# Patient Record
Sex: Female | Born: 1945
Health system: Southern US, Community
[De-identification: ages and names within clinical notes are randomized; demographics above are authoritative.]

## PROBLEM LIST (undated history)

## (undated) DIAGNOSIS — K5790 Diverticulosis of intestine, part unspecified, without perforation or abscess without bleeding: Secondary | ICD-10-CM

## (undated) DIAGNOSIS — I2510 Atherosclerotic heart disease of native coronary artery without angina pectoris: Secondary | ICD-10-CM

## (undated) DIAGNOSIS — E785 Hyperlipidemia, unspecified: Secondary | ICD-10-CM

## (undated) DIAGNOSIS — I10 Essential (primary) hypertension: Secondary | ICD-10-CM

## (undated) HISTORY — DX: Atherosclerotic heart disease of native coronary artery without angina pectoris: I25.10

## (undated) HISTORY — PX: THUMB ARTHROSCOPY: SHX2509

## (undated) HISTORY — DX: Essential (primary) hypertension: I10

## (undated) HISTORY — DX: Diverticulosis of intestine, part unspecified, without perforation or abscess without bleeding: K57.90

## (undated) HISTORY — DX: Hyperlipidemia, unspecified: E78.5

---

## 2001-12-22 HISTORY — PX: COLONOSCOPY: SHX174

## 2003-01-16 DIAGNOSIS — I2510 Atherosclerotic heart disease of native coronary artery without angina pectoris: Secondary | ICD-10-CM

## 2003-01-16 HISTORY — DX: Atherosclerotic heart disease of native coronary artery without angina pectoris: I25.10

## 2003-01-16 HISTORY — PX: CARDIAC CATHETERIZATION: SHX172

## 2003-11-09 ENCOUNTER — Inpatient Hospital Stay (HOSPITAL_COMMUNITY): Admission: AD | Admit: 2003-11-09 | Discharge: 2003-11-12 | Payer: Self-pay | Admitting: *Deleted

## 2003-11-11 ENCOUNTER — Encounter: Payer: Self-pay | Admitting: Cardiology

## 2003-11-22 ENCOUNTER — Ambulatory Visit (HOSPITAL_COMMUNITY): Admission: RE | Admit: 2003-11-22 | Discharge: 2003-11-22 | Payer: Self-pay | Admitting: Cardiology

## 2003-11-22 ENCOUNTER — Ambulatory Visit: Payer: Self-pay | Admitting: Cardiology

## 2003-12-06 ENCOUNTER — Encounter (HOSPITAL_COMMUNITY): Admission: RE | Admit: 2003-12-06 | Discharge: 2004-03-05 | Payer: Self-pay | Admitting: Cardiology

## 2003-12-14 ENCOUNTER — Ambulatory Visit: Payer: Self-pay | Admitting: Cardiology

## 2004-01-28 ENCOUNTER — Ambulatory Visit: Payer: Self-pay | Admitting: Cardiology

## 2004-02-02 ENCOUNTER — Ambulatory Visit: Payer: Self-pay | Admitting: Cardiology

## 2004-03-06 ENCOUNTER — Encounter (HOSPITAL_COMMUNITY): Admission: RE | Admit: 2004-03-06 | Discharge: 2004-06-04 | Payer: Self-pay | Admitting: Cardiology

## 2004-04-07 ENCOUNTER — Ambulatory Visit: Payer: Self-pay | Admitting: Cardiology

## 2004-04-14 ENCOUNTER — Ambulatory Visit: Payer: Self-pay | Admitting: Cardiology

## 2004-04-14 ENCOUNTER — Ambulatory Visit: Payer: Self-pay

## 2004-04-18 ENCOUNTER — Ambulatory Visit: Payer: Self-pay | Admitting: Cardiology

## 2004-05-03 ENCOUNTER — Ambulatory Visit: Payer: Self-pay

## 2004-08-03 ENCOUNTER — Ambulatory Visit: Payer: Self-pay | Admitting: Cardiology

## 2004-10-23 ENCOUNTER — Ambulatory Visit: Payer: Self-pay | Admitting: Cardiology

## 2005-01-24 ENCOUNTER — Ambulatory Visit: Payer: Self-pay | Admitting: Cardiology

## 2005-04-18 ENCOUNTER — Observation Stay (HOSPITAL_COMMUNITY): Admission: EM | Admit: 2005-04-18 | Discharge: 2005-04-19 | Payer: Self-pay | Admitting: *Deleted

## 2005-04-18 ENCOUNTER — Ambulatory Visit: Payer: Self-pay | Admitting: Cardiology

## 2005-04-25 ENCOUNTER — Ambulatory Visit: Payer: Self-pay | Admitting: Cardiology

## 2005-05-01 ENCOUNTER — Ambulatory Visit: Payer: Self-pay | Admitting: Cardiology

## 2005-09-04 ENCOUNTER — Ambulatory Visit: Payer: Self-pay | Admitting: Internal Medicine

## 2005-10-29 ENCOUNTER — Ambulatory Visit: Payer: Self-pay | Admitting: Cardiology

## 2006-07-01 ENCOUNTER — Ambulatory Visit: Payer: Self-pay | Admitting: Cardiology

## 2006-10-16 ENCOUNTER — Ambulatory Visit: Payer: Self-pay | Admitting: Cardiology

## 2007-07-15 ENCOUNTER — Ambulatory Visit: Payer: Self-pay | Admitting: Cardiology

## 2008-06-21 DIAGNOSIS — I10 Essential (primary) hypertension: Secondary | ICD-10-CM

## 2008-06-21 DIAGNOSIS — E785 Hyperlipidemia, unspecified: Secondary | ICD-10-CM

## 2008-06-21 DIAGNOSIS — I2510 Atherosclerotic heart disease of native coronary artery without angina pectoris: Secondary | ICD-10-CM

## 2008-06-22 ENCOUNTER — Ambulatory Visit: Payer: Self-pay | Admitting: Cardiology

## 2008-10-01 ENCOUNTER — Telehealth: Payer: Self-pay | Admitting: Cardiology

## 2009-07-25 ENCOUNTER — Ambulatory Visit: Payer: Self-pay | Admitting: Cardiology

## 2009-09-30 ENCOUNTER — Telehealth: Payer: Self-pay | Admitting: Cardiology

## 2009-10-06 ENCOUNTER — Ambulatory Visit: Payer: Self-pay | Admitting: Cardiology

## 2009-10-11 LAB — CONVERTED CEMR LAB
ALT: 15 units/L (ref 0–35)
AST: 19 units/L (ref 0–37)
Albumin: 4 g/dL (ref 3.5–5.2)
Alkaline Phosphatase: 51 units/L (ref 39–117)
BUN: 17 mg/dL (ref 6–23)
Basophils Absolute: 0 10*3/uL (ref 0.0–0.1)
Basophils Relative: 0.5 % (ref 0.0–3.0)
Bilirubin, Direct: 0.1 mg/dL (ref 0.0–0.3)
CO2: 28 meq/L (ref 19–32)
Calcium: 9 mg/dL (ref 8.4–10.5)
Chloride: 102 meq/L (ref 96–112)
Cholesterol: 149 mg/dL (ref 0–200)
Creatinine, Ser: 0.7 mg/dL (ref 0.4–1.2)
Eosinophils Absolute: 0.2 10*3/uL (ref 0.0–0.7)
Eosinophils Relative: 3.1 % (ref 0.0–5.0)
GFR calc non Af Amer: 88.44 mL/min (ref >60)
Glucose, Bld: 92 mg/dL (ref 70–99)
HCT: 39 % (ref 36.0–46.0)
HDL: 71.2 mg/dL (ref >39.00)
Hemoglobin: 13.4 g/dL (ref 12.0–15.0)
LDL Cholesterol: 71 mg/dL (ref 0–99)
Lymphocytes Relative: 39.2 % (ref 12.0–46.0)
Lymphs Abs: 1.9 10*3/uL (ref 0.7–4.0)
MCHC: 34.4 g/dL (ref 30.0–36.0)
MCV: 93.3 fL (ref 78.0–100.0)
Monocytes Absolute: 0.4 10*3/uL (ref 0.1–1.0)
Monocytes Relative: 7.2 % (ref 3.0–12.0)
Neutro Abs: 2.5 10*3/uL (ref 1.4–7.7)
Neutrophils Relative %: 50 % (ref 43.0–77.0)
Platelets: 179 10*3/uL (ref 150.0–400.0)
Potassium: 4.8 meq/L (ref 3.5–5.1)
RBC: 4.18 M/uL (ref 3.87–5.11)
RDW: 13 % (ref 11.5–14.6)
Sodium: 138 meq/L (ref 135–145)
TSH: 1.31 microintl units/mL (ref 0.35–5.50)
Total Bilirubin: 0.6 mg/dL (ref 0.3–1.2)
Total CHOL/HDL Ratio: 2
Total Protein: 6 g/dL (ref 6.0–8.3)
Triglycerides: 32 mg/dL (ref 0.0–149.0)
VLDL: 6.4 mg/dL (ref 0.0–40.0)
WBC: 5 10*3/uL (ref 4.5–10.5)

## 2009-10-25 ENCOUNTER — Encounter: Payer: Self-pay | Admitting: Cardiology

## 2010-02-12 LAB — CONVERTED CEMR LAB
ALT: 18 units/L (ref 0–35)
AST: 25 units/L (ref 0–37)
Albumin: 4 g/dL (ref 3.5–5.2)
Alkaline Phosphatase: 57 units/L (ref 39–117)
BUN: 12 mg/dL (ref 6–23)
Basophils Absolute: 0 10*3/uL (ref 0.0–0.1)
Basophils Relative: 0.2 % (ref 0.0–3.0)
Bilirubin, Direct: 0.1 mg/dL (ref 0.0–0.3)
CO2: 30 meq/L (ref 19–32)
Calcium: 8.8 mg/dL (ref 8.4–10.5)
Chloride: 105 meq/L (ref 96–112)
Cholesterol: 141 mg/dL (ref 0–200)
Creatinine, Ser: 0.7 mg/dL (ref 0.4–1.2)
Eosinophils Absolute: 0.1 10*3/uL (ref 0.0–0.7)
Eosinophils Relative: 2.3 % (ref 0.0–5.0)
GFR calc non Af Amer: 90.28 mL/min (ref >60)
Glucose, Bld: 97 mg/dL (ref 70–99)
HCT: 39.4 % (ref 36.0–46.0)
HDL: 75.1 mg/dL (ref >39.00)
Hemoglobin: 13.2 g/dL (ref 12.0–15.0)
LDL Cholesterol: 56 mg/dL (ref 0–99)
Lymphocytes Relative: 47.6 % — ABNORMAL HIGH (ref 12.0–46.0)
Lymphs Abs: 2.1 10*3/uL (ref 0.7–4.0)
MCHC: 33.6 g/dL (ref 30.0–36.0)
MCV: 93.4 fL (ref 78.0–100.0)
Monocytes Absolute: 0.3 10*3/uL (ref 0.1–1.0)
Monocytes Relative: 7.9 % (ref 3.0–12.0)
Neutro Abs: 1.8 10*3/uL (ref 1.4–7.7)
Neutrophils Relative %: 42 % — ABNORMAL LOW (ref 43.0–77.0)
Platelets: 180 10*3/uL (ref 150.0–400.0)
Potassium: 4.7 meq/L (ref 3.5–5.1)
RBC: 4.22 M/uL (ref 3.87–5.11)
RDW: 12.4 % (ref 11.5–14.6)
Sodium: 139 meq/L (ref 135–145)
TSH: 0.96 microintl units/mL (ref 0.35–5.50)
Total Bilirubin: 1 mg/dL (ref 0.3–1.2)
Total CHOL/HDL Ratio: 2
Total Protein: 6.5 g/dL (ref 6.0–8.3)
Triglycerides: 49 mg/dL (ref 0.0–149.0)
VLDL: 9.8 mg/dL (ref 0.0–40.0)
WBC: 4.3 10*3/uL — ABNORMAL LOW (ref 4.5–10.5)

## 2010-02-14 NOTE — Progress Notes (Signed)
Summary: order for lab work    Phone Note Call from Patient   Caller: Patient Reason for Call: Talk to Nurse Summary of Call: pt wants an order to have a cholesterol check here- [phone] Initial call taken by: Glynda Jaeger,  September 30, 2009 1:47 PM  Follow-up for Phone Call        [facility]. Sherri Rad, RN, BSN  September 30, 2009 2:11 PM  I spoke with the pt. She will come in next thursday for labs- cbc/bmet/lipid/liver/tsh. Follow-up by: Sherri Rad, RN, BSN,  September 30, 2009 2:17 PM

## 2010-02-14 NOTE — Assessment & Plan Note (Signed)
Summary: yearly/sl      Allergies Added: NKDA  Visit Type:  Follow-up Primary Provider:  [facility]  CC:  no complaints.  History of Present Illness: The patient is 65 years old and return for management of CAD. In 2005 she had an anterior wall MI treated with a Taxus stent to the LAD. Her ejection fraction was initially 30% but her ventricle but completely recovered to normal. She has done quite well since that time has had no recent chest pain shortness of breath or palpitations. She is very active and does horseback riding as well as other activities.  Her other problems include hypertension and hyperlipidemia.  Current Medications (verified): 1)  Benicar 20 Mg Tabs (Olmesartan Medoxomil) .... Take One Tablet By Mouth Once Daily 2)  Vytorin 10-40 Mg Tabs (Ezetimibe-Simvastatin) .... Take One Tablet By Mouth Dailyat Bedtime 3)  Plavix 75 Mg Tabs (Clopidogrel Bisulfate) .... Take One Tablet By Mouth Daily 4)  Aspirin 81 Mg Tbec (Aspirin) .... Take One Tablet By Mouth Daily 5)  Nitrostat 0.4 Mg Subl (Nitroglycerin) .... Take As Directed  Allergies (verified): No Known Drug Allergies  Past History:  Past Medical History: Reviewed history from 06/22/2008 and no changes required. 1. Coronary artery disease, status post anterior wall myocardial     infarction treated with a Taxus stent to the left anterior     descending.2005 2. Good left ventricular function. 3. Hyperlipidemia, under excellent control. 4. Hypertension, under excellent control.   Review of Systems       ROS is negative except as outlined in HPI.   Vital Signs:  Patient profile:   65 year old female Height:      63 inches Weight:      128 pounds BMI:     22.76 Pulse rate:   57 / minute Resp:     16 per minute BP sitting:   134 / 62  (left arm)  Vitals Entered By: Marrion Coy, CNA (July 25, 2009 4:19 PM)  Physical Exam  Additional Exam:  Gen. Well-nourished, in no distress   Neck:  No JVD, thyroid not enlarged, no carotid bruits Lungs: No tachypnea, clear without rales, rhonchi or wheezes Cardiovascular: Rhythm regular, PMI not displaced,  heart sounds  normal, no murmurs or gallops, no peripheral edema, pulses normal in all 4 extremities. Abdomen: BS normal, abdomen soft and non-tender without masses or organomegaly, no hepatosplenomegaly. MS: No deformities, no cyanosis or clubbing   Neuro:  No focal sns   Skin:  no lesions    Impression & Recommendations:  Problem # 1:  CAD (ICD-414.00) She had an anterior wall MI in 2005 treated with a Taxus stent in the LAD. Her ventricle completely recovered to normal LV function. She's had no recent chest pain this problem appears stable.  She is having some easy bruising and we discussed the possibility of her coming off Plavix. She has a brother and sister who had stents and they have stayed on Plavix and she feels most comfortable continuing the Plavix.  We will have her followup with Dr. Excell Seltzer in one year.   The following medications were removed from the medication list:    Metoprolol Succinate 25 Mg Xr24h-tab (Metoprolol succinate) .Marland Kitchen... Take one tablet by mouth daily Her updated medication list for this problem includes:    Plavix 75 Mg Tabs (Clopidogrel bisulfate) .Marland Kitchen... Take one tablet by mouth daily    Aspirin 81 Mg Tbec (Aspirin) .Marland Kitchen... Take one tablet by mouth daily  Nitrostat 0.4 Mg Subl (Nitroglycerin) .Marland Kitchen... Take as directed  Orders: EKG w/ Interpretation (93000)  The following medications were removed from the medication list:    Metoprolol Succinate 25 Mg Xr24h-tab (Metoprolol succinate) .Marland Kitchen... Take one tablet by mouth daily Her updated medication list for this problem includes:    Plavix 75 Mg Tabs (Clopidogrel bisulfate) .Marland Kitchen... Take one tablet by mouth daily    Aspirin 81 Mg Tbec (Aspirin) .Marland Kitchen... Take one tablet by mouth daily    Nitrostat 0.4 Mg Subl (Nitroglycerin) .Marland Kitchen... Take as directed  Problem #  2:  HYPERLIPIDEMIA (ICD-272.4) Her last lipid profile was excellent with an HDL of 75, and LDL of 56, total cholesterol 141. We will repeat her lipid profile with her primary care physician Her updated medication list for this problem includes:    Vytorin 10-40 Mg Tabs (Ezetimibe-simvastatin) .Marland Kitchen... Take one tablet by mouth dailyat bedtime  Problem # 3:  HYPERTENSION (ICD-401.9) Or blood pressure is well controlled on current medications. She is on Benicar so we will get a BMP. The following medications were removed from the medication list:    Metoprolol Succinate 25 Mg Xr24h-tab (Metoprolol succinate) .Marland Kitchen... Take one tablet by mouth daily Her updated medication list for this problem includes:    Benicar 20 Mg Tabs (Olmesartan medoxomil) .Marland Kitchen... Take one tablet by mouth once daily    Aspirin 81 Mg Tbec (Aspirin) .Marland Kitchen... Take one tablet by mouth daily  Patient Instructions: 1)  Your physician recommends that you schedule a follow-up appointment in: 1year with Dr. Excell Seltzer 2)  Your physician recommends that you  have lab work at [facility]:  cbc, bmp, lipid, liver, tsh with results faxed to our office. 3)  Your physician recommends that you continue on your current medications as directed. Please refer to the Current Medication list given to you today.

## 2010-02-16 NOTE — Letter (Signed)
Summary: Dr Perrin Maltese Office Note   Dr Angelia Mould Ingram's Office Note   Imported By: Roderic Ovens 02/02/2010 14:54:05  _____________________________________________________________________  External Attachment:    Type:   Image     Comment:   External Document

## 2010-05-27 ENCOUNTER — Encounter: Payer: Self-pay | Admitting: Physician Assistant

## 2010-05-30 ENCOUNTER — Ambulatory Visit (INDEPENDENT_AMBULATORY_CARE_PROVIDER_SITE_OTHER): Payer: 59 | Admitting: Physician Assistant

## 2010-05-30 ENCOUNTER — Encounter: Payer: Self-pay | Admitting: Physician Assistant

## 2010-05-30 VITALS — BP 148/80 | HR 53 | Resp 18 | Ht 63.0 in | Wt 129.1 lb

## 2010-05-30 DIAGNOSIS — I2510 Atherosclerotic heart disease of native coronary artery without angina pectoris: Secondary | ICD-10-CM

## 2010-05-30 DIAGNOSIS — R079 Chest pain, unspecified: Secondary | ICD-10-CM | POA: Insufficient documentation

## 2010-05-30 DIAGNOSIS — I10 Essential (primary) hypertension: Secondary | ICD-10-CM

## 2010-05-30 LAB — CBC WITH DIFFERENTIAL/PLATELET
Basophils Absolute: 0 10*3/uL (ref 0.0–0.1)
Basophils Relative: 0.3 % (ref 0.0–3.0)
Eosinophils Absolute: 0.1 10*3/uL (ref 0.0–0.7)
Eosinophils Relative: 2.2 % (ref 0.0–5.0)
HCT: 39.5 % (ref 36.0–46.0)
Hemoglobin: 13.5 g/dL (ref 12.0–15.0)
Lymphocytes Relative: 43.8 % (ref 12.0–46.0)
Lymphs Abs: 2.4 10*3/uL (ref 0.7–4.0)
MCHC: 34.2 g/dL (ref 30.0–36.0)
MCV: 92.8 fl (ref 78.0–100.0)
Monocytes Absolute: 0.4 10*3/uL (ref 0.1–1.0)
Monocytes Relative: 6.9 % (ref 3.0–12.0)
Neutro Abs: 2.6 10*3/uL (ref 1.4–7.7)
Neutrophils Relative %: 46.8 % (ref 43.0–77.0)
Platelets: 196 10*3/uL (ref 150.0–400.0)
RBC: 4.25 Mil/uL (ref 3.87–5.11)
RDW: 13.9 % (ref 11.5–14.6)
WBC: 5.5 10*3/uL (ref 4.5–10.5)

## 2010-05-30 LAB — BASIC METABOLIC PANEL
BUN: 16 mg/dL (ref 6–23)
CO2: 30 mEq/L (ref 19–32)
Calcium: 9.5 mg/dL (ref 8.4–10.5)
Chloride: 99 mEq/L (ref 96–112)
Creatinine, Ser: 0.8 mg/dL (ref 0.4–1.2)
GFR: 82.85 mL/min (ref >60.00)
Glucose, Bld: 83 mg/dL (ref 70–99)
Potassium: 4.7 mEq/L (ref 3.5–5.1)
Sodium: 136 mEq/L (ref 135–145)

## 2010-05-30 LAB — TSH: TSH: 1 u[IU]/mL (ref 0.35–5.50)

## 2010-05-30 NOTE — Assessment & Plan Note (Signed)
[facility]                            CARDIOLOGY OFFICE NOTE   LASHAY, OSBORNE                   MRN:          [id number]  DATE:10/16/2006                            DOB:          19-Dec-1945    The patient is a very pleasant 65 year old white female patient of Dr.  Charlies Constable who has a history of hypertension and coronary artery  disease, status post anterior wall MI treated with Taxus stenting to the  LAD in October of 2005.  She has residual 50% RCA and good LV function.  She comes in today because of trouble with increasing her blood  pressure.  She brings her blood pressure machine with her and it has  been up and down on occasion.  She says when it is up, she occasionally  notices a tingling in her jaw and then down both arms.  This occurs at  rest and not with exertion.  She exercises on an elliptical trainer at  the gym and does some weights and never has any exertional neck or arm  tingling.  These symptoms are always associated with an elevated blood  pressure and are not similar to her symptoms when she had her heart  attack.  Blood pressure readings range anywhere from 90/48 up to 172/90  was the highest.  She does state that she eats out occasionally, [employer] and so forth, and it may be associated with the times her  blood pressures have run high.  When she rushed in here today she had  taken her own blood pressure and it was 160/70, but when we took it, it  was 114/72.  Of note, Dr. Juanda Chance had decreased her Toprol from 25 to  12.5 mg daily, but since her blood pressures have been running higher,  she has increased this back up to 25 mg over the past week.  The patient  also has been having a lot of trouble with gas recently and has been  seeing the doctors at [facility] and she has been given  Nexium.  She is still having a lot of gas build up and is due to follow  up with them concerning this.  She  states this was when her blood  pressure started going up as well.   CURRENT MEDICATIONS:  1. Vytorin 10/40 mg daily.  2. Aspirin 81 mg daily.  3. Toprol XL 25 mg daily.  4. Plavix 75 mg daily.  5. Lisinopril 20 mg daily.  6. Nexium p.r.n.   PHYSICAL EXAMINATION:  GENERAL:  This is a very pleasant 65 year old  white female in no acute distress.  VITAL SIGNS:  Blood pressure 114/72, pulse 49, weight 125.  NEUROLOGY:  Without JVD, HJR, bruit, or thyroid enlargement.  LUNGS:  Clear anterior, posterior, and lateral.  HEART:  Regular rate and rhythm at 50 beats per minute, normal S1 and  S2.  No murmur, rub, bruit, thrill, or heave noted.  ABDOMEN:  Soft without organomegaly, masses, lesions, or abnormal  tenderness.  EXTREMITIES:  Without cyanosis, clubbing, or edema.  She has good distal  pulses.   EKG; sinus bradycardia at 49 beats per minute, T wave inversion in V1  unchanged from prior tracing.   IMPRESSION:  1. Labile hypertension.  2. Jaw and arm tingling associated with elevated blood pressures, rule      out ischemic equivalent.  3. Coronary artery disease, status post anterior wall myocardial      infarction treated with Taxus stent to the left anterior descending      in 2005 with residual 50% right coronary artery.  Normal left      ventricular function.  4. Hyperlipidemia.  5. Family history of coronary artery disease.  6. Recent gastrointestinal problem.   PLAN:  I have asked the patient to watch her sodium intake because of  her low heart rates.  I have asked her to decrease her metoprolol to  12.5 mg a day and then take an extra half if her blood pressure is  elevated later in the day.  In the meantime, we will do a stress Myoview  on her to rule out ischemia and she will follow up with Dr. Juanda Chance.      Jacolyn Reedy, PA-C  Electronically Signed      Everardo Beals. Juanda Chance, MD, [facility]  Electronically Signed   ML/MedQ  DD: 10/16/2006  DT: 10/16/2006  Job #:  [id number]   cc:   [facility]

## 2010-05-30 NOTE — Assessment & Plan Note (Signed)
[facility]                            CARDIOLOGY OFFICE NOTE   [name], [name]                   MRN:          [id number]  DATE:07/01/2006                            DOB:          13-Oct-1945    PRIMARY CARE PHYSICIAN:  [facility].   CLINICAL HISTORY:  [name] is 65 years old, and had an anterior  wall infarction in 2005 treated with a Taxus stent to the LAD.  Her  ejection fraction was initially 30% but then improved to normal, and she  had a negative Myoview scan with no scar or ischemia, subsequently.  She  had residual 50% narrowing in the right coronary artery.   She has done quite well and has had no recent chest pain, shortness of  breath, or palpitations.   PAST MEDICAL HISTORY:  Hypertension and hyperlipidemia.   CURRENT MEDICATIONS:  1. Vytorin.  2. Aspirin.  3. Toprol.  4. Plavix.  5. Benicar.   PHYSICAL EXAMINATION:  The blood pressure is 140/79, the pulse is 46 and  regular.  There was no venous distension.  Carotid pulses were full without  bruits.  Chest was clear without rales or rhonchi.  The cardiac rhythm was regular.  She had no murmurs or gallops.  The abdomen was soft without organomegaly.  Peripheral pulses were full and there was no peripheral edema.   An electrocardiogram was normal except for sinus bradycardia.   IMPRESSION:  1. Coronary artery disease, status post anterior wall myocardial      infarction treated with a Taxus stent to the LAD, 2005 with 50%      narrowing in the right coronary artery.  2. Ejection fraction initially 30%, now improved to normal.  3. Hypertension.  4. Hyperlipidemia.  5. Strong family history of coronary artery disease.   RECOMMENDATIONS:  I think [name] is doing quite well.  Her  insurance company recommended that she switch from Benicar to lisinopril  for cost savings, and I think this would be reasonable.  I cautioned her  about  potential side effects from cough.  Will put her on lisinopril 20  mg a day.  We will arrange a followup blood pressure check in a week.  She is also to get a followup lipid profile with [facility].  She asked about exercise heart rate  and I looked at her old exercise test, and I think getting a heart rate  from 90 up to 110-115 would be good for her.  Will plan to see her back  in followup in a year.     Bruce Elvera Lennox Juanda Chance, MD, [facility]  Electronically Signed    BRB/MedQ  DD: 07/01/2006  DT: 07/02/2006  Job #: [id number]

## 2010-05-30 NOTE — Assessment & Plan Note (Signed)
[facility]                                 ON-CALL NOTE   MORRISON, MASSER                     MRN:          [id number]  DATE:10/07/2006                            DOB:          1945/12/08    PRIMARY CARDIOLOGIST:  Dr. Charlies Constable.   [name] called tonight because she stated that she was having her  blood pressure run higher than usual, and occasionally was having some  problems with tingling in her arms and fingers.  Of note, [name]  went to the emergency room after work for these symptoms, where her  blood pressure was 178/76, which she said was taken just after she had  walked into the room and sat down.  She stated that she felt like it  would be a long time before she was seen in the emergency room, and she  did not want to wait, so she left.   [name] states that she takes 1/2 of a Toprol XL 25 mg daily as  well as Benicar 20.  Because of stress at work, she felt like her blood  pressure was running higher than normal, and so she has taken, over the  course of the evening, an extra 1/2 tablet of the Toprol XL, as well as  an extra Benicar.  She was concerned that they would kick in and make  her hypotensive, although currently she states her blood pressure  systolic is in the 150s.   I advised [name] that she needed evaluation for the hypertension.  I advised her that I could not tell how long it would take for her to be  seen in the emergency room, but if she felt she had any issues for which  she needed to be seen tonight, she should come to the emergency room by  EMS.  She stated that she did not feel it was that bad and did not wish  to do this.  I then requested that she take her blood pressure at  various times during the day and track these results, and bring them  with her when she comes to see Dr. Juanda Chance.  I have left a message for  her to get a call and an appointment as soon as possible.  She  stated  that she would do this.  I reiterated that she should call 9-1-1 and  come to the emergency room if her symptoms got any worse.      Theodore Demark, PA-C  Electronically Signed      Erin Pick. Eden Emms, MD, [facility]  Electronically Signed   RB/MedQ  DD: 10/07/2006  DT: 10/08/2006  Job #: [id number]

## 2010-05-30 NOTE — Assessment & Plan Note (Signed)
Somewhat uncontrolled.  She has had normal blood pressures at home as well as elevated blood pressures.  She feels that she is under more stress.  I have asked her to check her blood pressures and bring this with her to her next appointment.  If it continues to remain elevated, I will change her to Benicar/HCTZ 20/12.5 mg daily.

## 2010-05-30 NOTE — Progress Notes (Signed)
History of Present Illness: Primary Cardiologist: Dr. Tonny Bollman  [name] is a 65 y.o. female with a history of CAD, status post DES to the LAD in October 2005 secondary to anterior ST elevation myocardial infarction who was previously followed by Dr. Juanda Chance.  She will establish with Dr. Excell Seltzer and 7/12.  She presents today with complaints of chest discomfort and arm symptoms over the last 4 weeks.  She has felt some neck pain with tightness in her trapezius muscles bilaterally.  She denies any injury.  The pain in her chest is sharp and lasts for a few seconds and is a throbbing sensation.  She denies any exertional symptoms.  Her arm symptoms are described as tingling.  She denies any associated shortness of breath, nausea or diaphoresis.  She denies syncope or near-syncope.  She denies orthopnea, PND or edema.  She does note increased fatigue.  She's had a lot of stress at work.  She seemed to notice some relationship of her symptoms to meals.  She has taken some Zantac with some relief.  She did see her PCP who suggested that she follow up here.  Her symptoms are not getting any worse.  She has noted some changes with changes in positioning.  Her symptoms do not remind her of her myocardial infarction.  Past Medical History  Diagnosis Date  . Coronary artery disease 2005    a. s/p Ant STEMI 10/05 tx with Taxus DES to LAD;  b. cath 10/05: LM ok, LAD 99% tx with PCI, CFX ok, pRCA 50%, EF 30%;  c. echo 10/05: EF 55-60%, apical septal HK;  d. myoview 4/07: no scar or ischemia  . Hyperlipidemia   . Hypertension     Current Outpatient Prescriptions  Medication Sig Dispense Refill  . aspirin 81 MG EC tablet Take 81 mg by mouth daily.        . B Complex-C (SUPER B COMPLEX PO) Take by mouth daily.        . clopidogrel (PLAVIX) 75 MG tablet Take 75 mg by mouth daily.        Marland Kitchen ezetimibe-simvastatin (VYTORIN) 10-40 MG per tablet Take 1 tablet by mouth at bedtime.        . nitroGLYCERIN  (NITROSTAT) 0.4 MG SL tablet Place 0.4 mg under the tongue every 5 (five) minutes as needed.        Marland Kitchen olmesartan (BENICAR) 20 MG tablet Take 20 mg by mouth daily.          Allergies: No Known Allergies   History  Substance Use Topics  . Smoking status: Former Smoker -- 0.5 packs/day    Types: Cigarettes    Quit date: 01/15/1986  . Smokeless tobacco: Not on file  . Alcohol Use: 1.2 oz/week    1 Glasses of wine, 1 Cans of beer per week     BEER OR A GLASS OF WINE MOST NIGHTS    ROS:  Please see the history of present illness.  She denies fevers, chills, cough, melena, hematochezia.  She is going through menopause.  She did have some loose stools recently.  All other systems reviewed and negative.  Vital Signs: BP 148/80  Pulse 53  Resp 18  Ht 5\' 3"  (1.6 m)  Wt 129 lb 1.9 oz (58.568 kg)  BMI 22.87 kg/m2  PHYSICAL EXAM: Well nourished, well developed, in no acute distress HEENT: normal Neck: no JVD Vascular: No carotid bruits Endocrine: No thyromegaly Cardiac:  normal S1, S2; RRR;  no murmur Lungs:  clear to auscultation bilaterally, no wheezing, rhonchi or rales Abd: soft, nontender, no hepatomegaly Ext: no edema Skin: warm and dry Neuro:  CNs 2-12 intact, no focal abnormalities noted Psych: Normal affect  EKG:  Sinus bradycardia, heart rate 53, normal axis, nonspecific ST-T wave changes, no significant change when compared to prior tracings.  ASSESSMENT AND PLAN:

## 2010-05-30 NOTE — Assessment & Plan Note (Signed)
Her symptoms are somewhat atypical for ischemia.  She has not had an ischemic evaluation since 4/07.  I have recommended that she proceed with stress Myoview testing.  She also notes symptoms of fatigue.  I will check a basic metabolic panel, CBC and TSH.  Her symptoms seem to be related to meals as well as have a musculoskeletal component.  I recommended that she take Zantac 150 mg twice a day for 2 weeks.  If her workup is negative, she can follow up with her PCP for other causes.  I will bring her back in 2 weeks for follow up.

## 2010-05-30 NOTE — Assessment & Plan Note (Signed)
[facility]                            CARDIOLOGY OFFICE NOTE   [name], [name]                   MRN:          [id number]  DATE:07/15/2007                            DOB:          05-Jan-1946    PRIMARY CARE PHYSICIAN:  [facility].   CLINICAL HISTORY:  [name] is 65 years old and returns for  followup and management of her coronary heart disease.  She had an  anterior wall infarction treated in 2005 with a Taxus drug-eluting  stent.  Ejection fraction was 30%, but improved to normal.  She saw  [name] in October 2008 with an elevated blood pressure and her  medications were adjusted.  She has done quite well and has had no  recent chest pain, shortness breath, or palpitations.  She worked out  every morning at [employer] doing both aerobic and weight exercises and  some swimming.   PAST MEDICAL HISTORY:  Significant for hypertension and hyperlipidemia.   CURRENT MEDICATIONS:  1. Vytorin 10/40 daily.  2. Aspirin.  3. Toprol-XL 25 daily.  4. Plavix.  5. Benicar 20 mg daily.   PHYSICAL EXAMINATION:  VITAL SIGNS:  Blood pressure is 108/55 and the  pulse 52 and regular.  NECK:  There was no venous distention.  The carotid pulses were full  without bruits.  CHEST:  Clear.  CARDIAC:  Rhythm was regular.  There were no murmurs or gallops.  ABDOMEN:  Soft.  No organomegaly.  EXTREMITIES:  Distal pulses are full.  No peripheral edema.   Electrocardiogram was normal.   IMPRESSION:  1. Coronary artery disease, status post anterior wall myocardial      infarction treated with a Taxus stent to the left anterior      descending.  2. Good left ventricular function.  3. Hyperlipidemia, under excellent control.  4. Hypertension, under excellent control.   RECOMMENDATIONS:  I think [name] is doing quite well.  She has  thought cutting back on her medicines and we cut back her Benicar from  20 to 10 a day and I  told her that we might  be able to get her off that later.  We will continue her other  medications.  I will plan to see her back in followup in a year.     Bruce Elvera Lennox Juanda Chance, MD, [facility]  Electronically Signed    BRB/MedQ  DD: 07/15/2007  DT: 07/15/2007  Job #: [id number]

## 2010-05-30 NOTE — Patient Instructions (Addendum)
Keep track of your blood pressures for the next 2 weeks and bring recordings to your next appointment. Take Zantac 150 mg twice daily for 2 weeks, then as needed after that.   Your physician has requested that you have en exercise stress Myoview DX 786.50. For further information please visit [email or web address] Please follow instruction sheet, as given.  Your physician recommends that you return for lab work in: BMET, CBC, TSH 786.50, 401.9, 414.00

## 2010-05-30 NOTE — Assessment & Plan Note (Addendum)
Continue aspirin and Plavix.  Proceed with stress Myoview testing as noted.

## 2010-06-02 NOTE — Assessment & Plan Note (Signed)
[facility]                              CARDIOLOGY OFFICE NOTE   [name], [name]                   MRN:          [id number]  DATE:09/04/2005                            DOB:          1945/05/04    This is a 65 year old white female patient with history of coronary artery  disease, status post anterior wall MI, treated with Taxus stent to the  proximal LAD in October of 2005.  Ejection fraction 30% at that time, but  completely recovered now.  The patient comes in today because she had  several weeks of increased epigastric gas and indigestion.  This was  associated with some evening palpitations and elevation in her blood  pressure.  The patient had similar symptoms back in April and was started on  Prilosec, which helped her symptoms.  For some reason, she had stopped this  because she was feeling better, and they returned.  She re-initiated her  Prilosec.  Her GI problems are improving and her palpitations have resolved.  Her blood pressure remains elevated, mostly in the evening.  She says her  blood pressure is up to 180/90.  In the morning, it is back down to 115/64.  She does take an extra half of a Toprol when her blood pressure gets too  high.  She denies any chest pain, dizziness and presyncope and, once again,  her palpitations have resolved.   CURRENT MEDICATIONS:  1. Vytorin 10/40 mg daily.  2. Aspirin 81 mg daily.  3. Toprol XL 25 mg daily.  4. Prilosec daily.   PHYSICAL EXAMINATION:  GENERAL:  This is a pleasant, 65 year old white  female, in no acute distress.  VITAL SIGNS:  Blood pressure 158/81, pulse 49, weight 124.  NECK:  Without JVD, HJR, bruit or thyroid enlargement.  LUNGS:  Clear, anterior, posterior and lateral.  HEART:  Regular rate and rhythm, 49 beats per minute.  Normal S1, S2 with a  1/6 systolic murmur at the left sternal border.  ABDOMEN:  Soft without organomegaly, mass sensations or abnormal  tenderness.  EXTREMITIES:  Without clubbing, cyanosis or edema.  She has good distal  pulses.   EKG:  Sinus bradycardia at 49 beats per minute with poor R-wave progression  and nonspecific STT wave changes.   IMPRESSION:  1. Hypertension, uncontrolled.  2. Epigastric pain, gas and diarrhea.  Recommend gastroenterologist.  3. History of coronary artery disease, status post anterior wall      myocardial infarction, treated with Taxus stent to the proximal LAD in      October of 2005.  Ejection fraction 30% at the time, fully recovered.      Stress Myoview normal in April 2007.  4. Hyperlipidemia.  5. History of tobacco abuse.  6. Strong family history of coronary artery disease.  7. Palpitations, resolved.   PLAN AT THIS TIME:  The patient has been on Benicar in the past for  hypertension and tolerated it well, so I have given her a prescription for  20 mg a day.  I am reluctant to go up on the Toprol, given her sinus  bradycardia.  I have asked her to follow up with [facility]  in regards to her hypertension and she has seen Dr. Leone Payor in the past for  GI and I recommended she go see him.  She has an appointment to see Dr.  Charlies Constable back for her one-year followup in October.                                  Jacolyn Reedy, PA-C                            Luis Abed, MD, [facility]   ML/MedQ  DD:  09/04/2005  DT:  09/04/2005  Job #:  [id number]

## 2010-06-02 NOTE — Cardiovascular Report (Signed)
[name], PITT            ACCOUNT NO.:  [id number]   MEDICAL RECORD NO.:  [id number]          PATIENT TYPE:  OIB   LOCATION:  2899                         FACILITY:  [facility]   PHYSICIAN:  Charlies Constable, M.D. [facility] DATE OF BIRTH:  Oct 15, 1945   DATE OF PROCEDURE:  11/09/2003  DATE OF DISCHARGE:                              CARDIAC CATHETERIZATION   CLINICAL HISTORY:  The patient is 65 years old and has no prior history of  heart disease.  She lives near [place] and has been seen by Dr. Marzetta Board  group, but was visiting or working in [place] today and had the onset of  chest pain at 10:30 and drove herself to [facility] emergency room  where an ECG showed an acute anterior wall infarction.  She was seen by Dr.  Iona Coach and transferred here urgently for catheterization.  She was treated  with heparin and Integrilin.  Dr. Dorethea Clan did the diagnostic study and found  a 95% stenosis in the proximal LAD with TIMI II flow.  There was  anterolateral wall and apical hypo to akinesis.  The estimated ejection  fraction was 40%.   PROCEDURE:  The procedure was performed via the right femoral artery using  an arterial sheath and a Q3.5, 6 [place] guiding catheter with side holes.  We used an Office manager and crossed the lesion in the proximal LAD  with the wire without difficulty.  We pre-dilated with a 2.5 x 50 mm  Maverick performing two inflations up to 10 atmospheres for 30 seconds.  We  then deployed a 2.5 x 20 mm Taxus stent deploying this with one inflation at  14 atmospheres for 30 seconds.  We then post-dilated with a 2.75 x 15 mm  Quantum Maverick performing two inflations up to 16 atmospheres for 30  seconds.  Repeat diagnostics was then performed with the guiding catheter.  The patient tolerated the procedure well and the patient was in satisfactory  condition.   RESULTS:  Initially, the stenosis in the proximal LAD was 95% with TIMI II  flow.  Following stenting, this  improved to less than 10% with TIMI III  flow.   CONCLUSION:  1.  Acute anterior wall myocardial infarction with 95% stenosis of the      proximal LAD, no major obstruction of the circumflex artery, 50%      narrowing in the mid right coronary artery and anterolateral wall and      apical wall hypo to akinesis as documented by Dr. Dorethea Clan.  2.  Successful stent deployment in the proximal left anterior descending      coronary artery with a Taxus drug eluting stent with improvement in the      narrowing from 95% to less than 10% and improvement in the flow from      TIMI II to TIMI III flow.   The patient had onset of chest pain at 10:30 and arrived at [facility] emergency room at 11:38.  The first balloon inflation was at 14:31.  This gave a total time of 2 hours and 53 minutes from [facility] and  a reperfusion time of 4 hours and 1 minute.   The patient was returned to the post angioplasty unit.  We anticipate good  recovery of left ventricular function since there was some wall motion  present on the left ventriculogram and since the area was opened.       BB/MEDQ  D:  11/09/2003  T:  11/09/2003  Job:  [id number]   cc:   Britt Bottom, M.D.  [place], [place]   Teena Irani. Arlyce Dice, M.D.  [address]  [place]  [place] [address]  Fax: [phone]   Vida Roller, M.D.  Fax: [phone]

## 2010-06-02 NOTE — H&P (Signed)
NAME:  [name], [name] NO.:  [id number]   MEDICAL RECORD NO.:  [id number]          PATIENT TYPE:  EMS   LOCATION:  MAJO                         FACILITY:  [facility]   PHYSICIAN:  Willa Rough, M.D.     DATE OF BIRTH:  1945-06-30   DATE OF ADMISSION:  04/18/2005  DATE OF DISCHARGE:                                HISTORY & PHYSICAL   CARDIOLOGIST:  Charlies Constable, M.D.   FAMILY PHYSICIAN:  [facility].   CHIEF COMPLAINT:  Chest pressure.   HISTORY OF PRESENT ILLNESS:  This is a 65 year old Caucasian female with a  history of chest pressure for approximately 2-3 days that radiates from  front to back.  The patient states that this is like a gas pressure that was  associated with diarrhea last night.  The patient seems to be worse with  eating; however, the patient has had nothing to eat all day, and continues  to have pain.  The pain is a dull 7/10.  The patient states that she has not  had pain like this before, even with her acute wall myocardial infarction in  2005.   ALLERGIES:  No known allergies.   PAST MEDICAL HISTORY:  1.  She had an anterior wall myocardial infarction with a stent to the      proximal LAD done in October of 2005, and that was treated with a TAXUS      stent.  Her initial ejection fraction at that point was 30%, but it      recovered completely.  She was enrolled in the Triton study, but missed      her Triton study drug on January 24, 2005.  She also had a normal left      ventricular ejection fraction by Myoview of 66% in March of 2006.  2.  She also has a history of hypertension.  3.  Hyperlipidemia.   SOCIAL HISTORY:  The patient lives in [place], [place], with her  spouse.  She is a Corporate treasurer.  She has no children.  She has a history  of smoking approximately 20-25 years ago.  She smoked less than a pack a day  at that point.  She states that she has either a beer or a glass of wine  most nights.  She  continues to do cardiovascular workout, golfing and riding  horses.  She states that her diet is fairly healthy.  Her herbal medications  include __________ and Omega-3.   FAMILY HISTORY:  Her mother is deceased at age 57 due to CAD.  Father is  deceased in his 30s due to pancreatic cancer.  She has two brothers, one who  age 73 and has had 3 MIs that started in his 23s.   REVIEW OF SYSTEMS:  CONSTITUTIONAL:  No fevers or sweats; however, was  having some chills last night.  HEENT:  Headache, nasal drainage.  No  vertigo or vision changes.  SKIN:  No rashes or lesions.  CARDIOPULMONARY:  She has positive chest pain.  No shortness of breath, dyspnea on exertion,  PND, edema, presyncope, claudication,  cough or wheezing.  GU:  She has no  frequency, urination, hematuria or nocturia.  NEUROPSYCHIATRIC:  No  weakness, numbness, mood disturbance, depression or anxiety.  MUSCULOSKELETAL:  She has some arthralgias in her fingers.  GI:  She had  some diarrhea last night.  None this morning.  She does have occasional GERD  symptoms.  No abdominal pain.  ENDOCRINE:  She has no polyuria, dipsia or  cold intolerance.   PHYSICAL EXAMINATION:  VITAL SIGNS:  Temperature today is 98.0, pulse 55,  respiratory rate 18, and blood pressure is 152/70.  She is saturating 99% on  room air.  GENERAL:  She is slender, somewhat uncomfortable, but in no acute distress.  HEENT:  Pupils equal, round and reactive to light.  Extraocular movements  intact.  Sclerae are clear.  TMs are clear.  Dentition are her own.  No  erythema or exudate noted.  NECK:  Supple without lymphadenopathy, TMJ, bruit or JVD.  CARDIOVASCULAR:  Regular rate and rhythm.  S1 and S2.  No murmurs, rubs, or  gallops.  Pulses were 2+ and equal bilaterally.  LUNGS:  Clear to auscultation without wheezing, rales, or rhonchi.  SKIN:  No rashes or lesions.  ABDOMEN:  Soft, nontender.  No rebound, no guarding, no organomegaly  appreciated.   EXTREMITIES:  No clubbing, cyanosis, or edema.  No rashes, no lesions.  MUSCULOSKELETAL:  No joint deformity or effusions.  No spine or CVA  tenderness.  NEUROLOGIC:  Alert and oriented.  Cranial nerves II-XII grossly intact.   CHEST X-RAY:  Pending at this time.   ELECTROCARDIOGRAM:  Sinus bradycardia with a rate of 52.  No ST changes  noted.   LABORATORY DATA:  Pending at this time also.   ASSESSMENT AND PLAN:  Dr. Myrtis Ser in to see and examine the patient.  The pain  is somewhat atypical in a patient with previous history of coronary artery  disease with stent placement to the proximal LAD.  However, the pain is not  like cardiac pain.  It could be indigestion.  She has had it for days.  She  has also had some diarrhea.  At this point, we doubt that this is cardiac  pain.  I think it is gastrointestinal, but we are going to admit to make  sure.  We will check enzymes.  No heparin or Lovenox.  Give her GI  medications.  Add a GI consult in the morning, and be sure to check a chest  x-ray result later today.     ______________________________  April Humphrey, NP    ______________________________  Willa Rough, M.D.    AH/MEDQ  D:  04/18/2005  T:  04/18/2005  Job:  [id number]

## 2010-06-02 NOTE — Discharge Summary (Signed)
NAMEBIRD, TAILOR Erin.:  [id number]   MEDICAL RECORD Erin.:  [id number]          PATIENT TYPE:  INP   LOCATION:  2007                         FACILITY:  [facility]   PHYSICIAN:  Willa Rough, M.D.     DATE OF BIRTH:  Feb 28, 1945   DATE OF ADMISSION:  04/18/2005  DATE OF DISCHARGE:  04/19/2005                                 DISCHARGE SUMMARY   PRIMARY CARDIOLOGIST:  Dr. Charlies Constable   PRIMARY CARE PHYSICIAN:  [facility]   DISCHARGE DIAGNOSES:  1.  Atypical chest pain/epigastric discomfort.  Negative cardiac enzymes.  2.  Sinus brady in setting of beta blocker.  3.  Indigestion/diarrhea, questionable gastroesophageal reflux      disease/gastritis.   PAST MEDICAL HISTORY:  1.  Coronary artery disease status post anterior wall myocardial infarction      with a stent to the proximal left anterior descending in October of 2005      (TAXUS stent).  Ejection fraction of 30% at that time, but recovered      completely.  Also enrolled in the TRITON study.  Status post gaited      Myoview in March of 2006 showing a normal left ventricular ejection      fraction at 66%.  2.  Hypertension.  3.  Hyperlipidemia.  4.  History of tobacco abuse.  5.  Occasional alcohol use.  6.  Strong family history of CAD.   HOSPITAL COURSE:  [name] is a 65 year old Caucasian female with  history of coronary artery disease status post TAXUS stent to the proximal  LAD in October of 2005 who presented to [facility] Emergency Room on day of  admission complaining of chest pressure.  Dr. Myrtis Ser did patient's initial  examination, felt patient's pain was somewhat atypical from previous history  of coronary artery disease.  Patient complained of indigestion and diarrhea.  Patient was admitted, treated with proton-pump inhibitor.  Cardiac enzymes  were cycled.  Cardiac enzymes negative x3.  Patient with improvement in  epigastric discomfort.  Dr. Juanda Chance in to see patient on  day discharge.  Patient afebrile.  Blood pressure stable, sinus brady.  Heart rate 46-54.  Patient up ambulating without complaints of chest discomfort or shortness of  breath.  Patient being discharged home for outpatient Myoview at discharge.  Diet restriction includes low fat, low sodium.  Activity as tolerated.  Wound care not applicable.  Nitroglycerin as needed for chest discomfort.  Prescription for Nexium 40 mg daily.  Patient instructed to continue her  aspirin, Vytorin, Plavix as previous.  She has also been instructed to  decrease her Toprol dose from 50 to 25 mg in setting of ongoing sinus brady  during this hospitalization.  She is to follow up with Tereso Newcomer on April  17 at 10:45 a.m. for appointment post stress Myoview.  I scheduled her for a  gaited Myoview on Wednesday, April 11 at 12 noon.  Patient instructed to  wear comfortable shoes.  Nothing to eat or drink after midnight before test.  Patient instructed to  hold her beta blocker before testing.  She is to follow up with her primary  care physician as needed.  Patient agrees to call within the next two weeks  for an appointment.   DURATION OF DISCHARGE ENCOUNTER:  30 minutes.      Dorian Pod, NP    ______________________________  Willa Rough, M.D.    MB/MEDQ  D:  04/19/2005  T:  04/20/2005  Job:  [id number]   cc:   [facility]

## 2010-06-02 NOTE — Cardiovascular Report (Signed)
NAMECASSADIE, PANKONIN NO.:  [id number]   MEDICAL RECORD NO.:  [id number]          PATIENT TYPE:  OIB   LOCATION:  2899                         FACILITY:  [facility]   PHYSICIAN:  Vida Roller, M.D.   DATE OF BIRTH:  1945/09/17   DATE OF PROCEDURE:  11/09/2003  DATE OF DISCHARGE:                              CARDIAC CATHETERIZATION   PRIMARY CARE PHYSICIAN:  [facility].   PROCEDURES PERFORMED:  1.  Left heart catheterization.  2.  Selective coronary angiography.  3.  Left ventriculography.   HISTORY OF PRESENT ILLNESS:  Mrs. Sudol is a 65 year old woman with no  significant past medical history who presents with an acute anterior wall  myocardial infarction to the ER at [facility] and was transported  emergently here.  When she arrived in the catheterization laboratory, her  pain was gone.  She had ST segment elevation, however, residual in her EKG  strips.   DESCRIPTION OF PROCEDURE:  After obtaining informed consent, the patient was  randomized to the TRITON trial.  She then was prepped and draped in the  usual sterile manner and local anesthetic was obtained over the right groin  using 1% lidocaine without epinephrine.  The right femoral artery was  cannulated using the modified Seldinger technique with a 6 French 10 cm  sheath and the left heart catheterization was performed using a 6 French  Judkins left #4, 6 French Judkins right #4 and a 6 French angled pigtail  catheter. The pigtail catheter was used for left ventriculography in the 30  degree RAO view.  At the conclusion of the procedure, the catheters were  removed.  The patient had a small hematoma around the catheter but otherwise  was in good shape and she underwent an acute coronary intervention.   RESULTS:  Aortic pressure 113/69 with a mean arterial pressure of 88 mmHg.   Left ventricular pressure 126/14 with end diastolic pressure of 23 mmHg.   Coronary anatomy:  The right coronary artery is a large dominant vessel with a 50% lesion in  its proximal portion before the acute marginal.  It does not appear to be  flow limiting.   The left main coronary artery is a large artery which is angiographically  normal.   The left anterior descending coronary artery has a 99% occlusion at the  second diagonal site with TIMI-3 flow.  It is a large transapical vessel.  There is a large diagonal prior to the occlusion which is widely patent.   The left circumflex coronary artery is a large artery which has multiple  branching obtuse marginals and posterolateral branches and is free of  disease.   Left ventriculography reveals significant depressed LV function at 30% with  anterior apical hypokinesis, no mitral regurgitation was seen.   ASSESSMENT:  1.  Two vessel coronary disease, culprit lesion being the left anterior      descending.  2.  ST segment elevation myocardial infarction in the anterior wall.  3.  Depressed left ventricular systolic function.   PLAN:  Percutaneous revascularization of the left anterior descending.  Will  do  a Cardiolite examination in six months to evaluate her right coronary  artery.  I will do that as an outpatient.      Trey Paula   JH/MEDQ  D:  11/09/2003  T:  11/09/2003  Job:  [id number]   cc:   [facility]

## 2010-06-02 NOTE — Assessment & Plan Note (Signed)
[facility]                              CARDIOLOGY OFFICE NOTE   [name], [name]                   MRN:          [id number]  DATE:10/29/2005                            DOB:          [birth year]    PRIMARY CARE PHYSICIAN:  [facility].   CLINICAL HISTORY:  [name] is 65 years old an had an anterior wall  infarction in October 2005 treated with a TAXUS stent to the LAD.  She had  residual 50% narrowing in the right coronary artery. Ejection fraction was  30% but subsequently recovered to normal.  She was hospitalized in April  with an episode of chest pain and diarrhea, and myocardial infarction was  ruled out.  She has done quite well since that time, has had no chest pain  or shortness of breath.  She does have occasional palpitations.   PAST MEDICAL HISTORY:  Significant for:  1. Hyperlipidemia.  2. Hypertension.   CURRENT MEDICATIONS:  Include Vitorin, aspirin, Toprol, Prilosec, and  Plavix.  She was on Benicar which was prescribed by Wende Bushy for  uncontrolled blood pressure, but she did stop this a number of weeks ago.   PHYSICAL EXAMINATION:  VITAL SIGNS: Blood pressure 166/79, pulse 45 and  regular.  NECK: There was no venous distention.  Carotid pulses were full without  bruits.  CHEST:  Clear.  CARDIAC:  Rhythm was regular.  No murmurs or gallops.  ABDOMEN:  Soft without organomegaly.  EXTREMITIES:  Peripheral pulses were full.  There was no peripheral edema.   An electrocardiogram showed sinus bradycardia, otherwise normal.   IMPRESSION:  1. Coronary artery disease status post anterior wall myocardial infarction      treated with a TAXUS stent to the left anterior descending in 2005 with      residual narrowing of the right coronary artery.  2. Ejection fraction initially 30%, now improved to normal.  3. Hypertension, not optimally controlled.  4. Hyperlipidemia with excellent lipid panel.  5.  Strong family history of coronary disease.   RECOMMENDATIONS:  I think [name] is doing well with the exception  that her blood pressure is not under ideal control.  I recommend that she  resume the Benicar.  She is to follow up with [facility].  She also said that her insurance will not pay for Toprol  XL, so we will try to find a substitute, perhaps metoprolol XL 25 mg daily.  I will see her back in followup visit in a year.            ______________________________  Everardo Beals. Juanda Chance, MD, [facility]     BRB/MedQ  DD:  10/29/2005  DT:  10/29/2005  Job #:  [id number]

## 2010-06-02 NOTE — Discharge Summary (Signed)
NAMERANDILYN, FOISY            ACCOUNT NO.:  [id number]   MEDICAL RECORD NO.:  [id number]          PATIENT TYPE:  INP   LOCATION:  3705                         FACILITY:  [facility]   PHYSICIAN:  Vida Roller, M.D.   DATE OF BIRTH:  October 07, 1945   DATE OF ADMISSION:  11/09/2003  DATE OF DISCHARGE:  11/12/2003                                 DISCHARGE SUMMARY   PROCEDURES:  1.  Cardiac catheterization.  2.  Coronary arteriogram.  3.  Left ventriculogram.  4.  PTCA and Taxus stent to one vessel.   HOSPITAL COURSE:  Ms. Voth is a 65 year old female with no known  history of coronary artery disease.  She lives in [place] and is currently  working in [place].  She went to work on the day of admission, but  at 10:45 developed a burning substernal chest pain.  It got worse and was  associated with diaphoresis and shortness of breath.  She went to [facility] at approximately 11:30 a.m.  She received aspirin, nitroglycerin,  IV Lopressor, heparin, IV nitroglycerin, and morphine.  She was also started  on Integrilin.  Her EKG was indicative of an acute MI and she was  transferred urgently to [facility].   The cardiac catheterization showed a 99% LAD.  She had a 50% stenosis in the  RCA and no other disease.  Her EF was 30% with apical hypokinesis and no MR.  She had a Taxus stent to the LAD reducing the stenosis to less than 10% and  had TIMI 3 flow post procedure.   The day after the procedure the patient had no chest pain, no shortness of  breath, and felt much better.  She was seen by cardiac rehabilitation and  was ambulating 600 feet with one assistant.  Her heart rate and blood  pressure were normal after ambulation.   As part of her evaluation an echocardiogram was ordered two days after the  MI to see how her LV was recovering.  The EF was estimated at 55-60%.  The  apical septum was hypokinetic but no other wall motion abnormalities were  identified.  It was felt that she did not need Coumadin.   As part of her evaluation, a lipid profile was performed.  It showed total  cholesterol 161, triglycerides 65, HDL 73, LDL 75.  She had initially been  placed on empiric Lipitor 80 but this was changed to Zocor 40 at discharge.   Ms. Zobrist was seen by the research nurses prior to her catheterization  and agreed to participate in the TRITON study.  She is to be on aspirin, but  not Plavix and is to continue to take the TRITON study medication.   By November 12, 2003 Ms. Snowberger was feeling well.  Her vital signs were  stable and she was ambulating without chest pain or shortness of breath.  She was considered stable for discharge on November 12, 2003 with outpatient  follow-up arranged.   CONDITION ON DISCHARGE:  Improved.   DISCHARGE DIAGNOSES:  1.  Acute anterior myocardial infarction status post Taxus stent to  the left      anterior descending.  2.  Residual coronary artery disease in the right coronary artery of 50%.      Medical therapy recommended.  3.  Preserved left ventricular function with an ejection fraction of 55-60%      by echocardiogram post myocardial infarction.  4.  Mild hyperlipidemia.  5.  Family history of coronary artery disease.   DISCHARGE INSTRUCTIONS:  She needs to get cholesterol/liver tests in 6-12  weeks.  She is not to drive for five days and is to increase her activity  per rehabilitation guidelines.  She is to return to work when cleared by  M.D.  She is to stick to a low fat diet.  She is to call the office for  problems with the catheterization site.  She is to follow up with [facility]  for Dr. Juanda Chance on November 11 at 4 p.m. and follow up with her physicians at  [facility] as scheduled.  She is also to see Dr. Juanda Chance in  follow-up on January 13 at 4:15.   DISCHARGE MEDICATIONS:  1.  TRITON study medication daily.  2.  Evista daily.  3.  Aspirin 325 mg daily.  4.   Zocor 40 mg daily.  5.  Lopressor 50 mg b.i.d.  6.  Nitroglycerin sublingual p.r.n.      Rhon   RB/MEDQ  D:  11/12/2003  T:  11/12/2003  Job:  [id number]   cc:   [facility]   Charlies Constable, M.D. [facility]

## 2010-06-02 NOTE — H&P (Signed)
NAMELAPORTIA, CARLEY            ACCOUNT NO.:  [id number]   MEDICAL RECORD NO.:  [id number]          PATIENT TYPE:  OIB   LOCATION:  2899                         FACILITY:  [facility]   PHYSICIAN:  Vida Roller, M.D.   DATE OF BIRTH:  02/18/1945   DATE OF ADMISSION:  11/09/2003  DATE OF DISCHARGE:                                HISTORY & PHYSICAL   CHIEF COMPLAINT:  Chest pain.   HISTORY OF PRESENT ILLNESS:  Ms. Pelot is a 65 year old female with no  known history of coronary artery disease.  She was in her usual state of  health and went to work today in [place] from her home in [place].  At approximately 10:45 she developed a burning/pressure-type substernal  chest pain.  It got worse steadily and radiated to her left arm.  It was  associated with diaphoresis and shortness of breath but no nausea or  vomiting.  It reached an 8/10.  She went to [facility] about 11:30  a.m.  They treated her with various medications, including aspirin,  nitroglycerin, IV Lopressor total of 10 mg, heparin, IV nitroglycerin,  morphine, and Integrilin.  Her EKG was indicative of an acute anterior MI,  and she was transferred urgently to [facility] for further  catheterization and treatment.   She states she has never had this kind of pain before.  She had a stress  test last year, which she stated was secondary to an abnormal EKG, but this  was ordered by her primary care physician, and she has never had chest pain  until today.   PAST MEDICAL HISTORY:  She denies any history of diabetes, hypertension,  hyperlipidemia.  She had a stress test last year.  Her only surgery is thumb  surgery.   ALLERGIES:  No known drug allergies, although she has never had contrast  before.   MEDICATIONS:  Evista daily.   SOCIAL HISTORY:  She lives in [place] with her husband and works in  Arts administrator.  She quit tobacco greater than 25 years ago and occasionally uses  alcohol but does not  abuse it and does not abuse drugs.   FAMILY HISTORY:  Her mother died at age 25 with a history of coronary artery  disease starting in her 86s.  Her father died in his 30s and had cancer but  no heart disease.  She has a brother who is alive and well after three MIs,  and he had his first MI at age 14.   REVIEW OF SYSTEMS:  Significant for sweats as described above.  She also has  chest pain as described above.  She has not had any urinary problems.  She  does not have arthralgias.  She does not have any melena or hematemesis.  Review of systems is otherwise negative.   PHYSICAL EXAMINATION:  VITAL SIGNS:  She is afebrile.  Blood pressure  144/86, heart rate 96, respiratory rate 18, O2 saturation 94% on 2 L.  GENERAL:  She is a slender, middle-aged white female in no acute distress.  HEENT:  Head is normocephalic and atraumatic with extraocular movements  intact.  Sclerae clear.  Nares without discharge.  NECK:  There is no lymphadenopathy, thyromegaly, bruit, or JVD noted.  CARDIOVASCULAR:  Her heart is regular in rate and rhythm with S1, S3, and an  S4, but no significant murmur or rub.  Her distal pulses are 2+ in all four  extremities, and no femoral bruits are appreciated.  SKIN:  No rashes or lesions are noted.  ABDOMEN:  Soft and nontender with active bowel sounds.  EXTREMITIES:  There is no cyanosis, clubbing, or edema.  MUSCULOSKELETAL:  There is no joint deformity or effusions.  NEUROLOGIC:  She is alert and oriented with cranial nerves II-XII grossly  intact.   EKG:  Rate 76, sinus rhythm, with PVCs.  She has ST elevation in V2-4  indicative of an acute MI.  Chest x-ray is pending.   LABORATORY DATA:  Hemoglobin 15.0, hematocrit 45.4, WBC 6.9, platelets 280.  Sodium 141, potassium 3.6, chloride 103, CO2 27, BUN 23, creatinine 0.9,  glucose 108.  Other CMET values within normal limits.  PT 1.0, PTT 28.   ASSESSMENT AND PLAN:  1.  Acute anterior myocardial infarction.   The patient is being taken      urgently to the catheterization lab.  She has had aspirin, heparin,      Integrilin, intravenous nitroglycerin, and morphine.  She is currently      pain-free but her electrocardiogram changes have not resolved, so we      will catheterize her urgently.  2.  Unknown lipid status.  We will check a lipid profile in a.m.  3.  The patient is otherwise stable.      Rhon   RB/MEDQ  D:  11/09/2003  T:  11/09/2003  Job:  [id number]

## 2010-06-05 ENCOUNTER — Telehealth: Payer: Self-pay | Admitting: Physician Assistant

## 2010-06-05 NOTE — Telephone Encounter (Signed)
Pt calling re test that were set up for her next week, thinks the symptoms she was having may have been indigestion, started taking zantac and feels better, symptoms are gone-wants to talk with nurse before cxl test

## 2010-06-05 NOTE — Telephone Encounter (Signed)
Pt wants to cx stress test. Says she is taking Zantac and feels better now.

## 2010-06-07 NOTE — Telephone Encounter (Signed)
Okay 

## 2010-06-07 NOTE — Telephone Encounter (Signed)
S/w pt's husband and stated Tereso Newcomer, PA-C would like for her to still have the stress test especially since it had been 5 yrs and does have CAD, husband gave verbal understanding today.  Danielle Rankin

## 2010-06-07 NOTE — Telephone Encounter (Signed)
I'm glad she feels better. But, I would still recommend she have the stress test.  It has been 5 years since her last test and she has a h/o CAD. Tereso Newcomer, PA-C

## 2010-06-08 ENCOUNTER — Telehealth: Payer: Self-pay | Admitting: Cardiovascular Disease

## 2010-06-08 NOTE — Telephone Encounter (Signed)
Left message for pt to call back  °

## 2010-06-08 NOTE — Telephone Encounter (Signed)
PT THINKS MYOVIEW IS NOT NEEDED DUE TO SEEING SCOTT SHE WANTS TO CHECK WITH YOU AND TALK ABOUT IT

## 2010-06-08 NOTE — Telephone Encounter (Signed)
Returning call back to lauren 

## 2010-06-08 NOTE — Telephone Encounter (Signed)
I spoke with the pt and made her aware that Danielle Rankin had spoken with the pt's husband on Monday.  The pt said she did not get this message.  I made the pt aware that we would still recommend that she proceed with cardiac testing at this time due to her symptoms and lack of testing over the past 5 years.  The pt agreed to proceed with further testing.

## 2010-06-14 ENCOUNTER — Ambulatory Visit (HOSPITAL_COMMUNITY): Payer: 59 | Attending: Cardiovascular Disease | Admitting: Radiology

## 2010-06-14 VITALS — Ht 63.0 in | Wt 130.0 lb

## 2010-06-14 DIAGNOSIS — I2510 Atherosclerotic heart disease of native coronary artery without angina pectoris: Secondary | ICD-10-CM

## 2010-06-14 DIAGNOSIS — R0789 Other chest pain: Secondary | ICD-10-CM

## 2010-06-14 DIAGNOSIS — R079 Chest pain, unspecified: Secondary | ICD-10-CM

## 2010-06-14 MED ORDER — TECHNETIUM TC 99M TETROFOSMIN IV KIT
11.0000 | PACK | Freq: Once | INTRAVENOUS | Status: AC | PRN
  Administered 2010-06-14: 11 via INTRAVENOUS

## 2010-06-14 MED ORDER — TECHNETIUM TC 99M TETROFOSMIN IV KIT
33.0000 | PACK | Freq: Once | INTRAVENOUS | Status: AC | PRN
  Administered 2010-06-14: 33 via INTRAVENOUS

## 2010-06-14 NOTE — Progress Notes (Signed)
[facility] SITE 3 NUCLEAR MED [address] [place] [address] [phone]  Cardiology Nuclear Med Study  [name] is a 65 y.o. female [id number] [birth year]   Nuclear Med Background Indication for Stress Test:  Evaluation for Ischemia and Stent Patency History: 10/05 Echo: EF 55-60% apical septal hypokinesis, 10/05 Heart Catheterization: EF 30% 99% LAD, 50% pRCA (sent for stent), 10/05 Myocardial Infarction:AWSTEMI, 04/07 Myocardial Perfusion Study:EF 63% (- for ischemia) and 10/05 Stents: LAD Cardiac Risk Factors: Family History - CAD, History of Smoking, Hypertension and Lipids  Symptoms:  Chest Pressure and Palpitations   Nuclear Pre-Procedure Caffeine/Decaff Intake:  None NPO After: 7:30pm   Lungs:  clear IV 0.9% NS with Angio Cath:  20g  IV Site: R Antecubital  IV Started by:  Bonnita Levan, RN  Chest Size (in):  34 Cup Size: B  Height: 5\' 3"  (1.6 m)  Weight:  130 lb (58.968 kg)  BMI:  Body mass index is 23.03 kg/(m^2). Tech Comments:  N/A    Nuclear Med Study 1 or 2 day study: 1 day  Stress Test Type:  Stress  Reading MD: Kristeen Miss, MD  Order Authorizing Provider:  Dr. Dayle Points  Resting Radionuclide: Technetium 53m Tetrofosmin  Resting Radionuclide Dose: 11 mCi   Stress Radionuclide:  Technetium 10m Tetrofosmin  Stress Radionuclide Dose: 33 mCi           Stress Protocol Rest HR: 56 Stress HR: 136  Rest BP: 143/82 Stress BP: 209/73  Exercise Time (min): 9:00 METS: 10.10   Predicted Max HR: 157 bpm % Max HR: 49.68 bpm Rate Pressure Product: 46962   Dose of Adenosine (mg):  n/a Dose of Lexiscan: n/a mg  Dose of Atropine (mg): n/a Dose of Dobutamine: n/a mcg/kg/min (at max HR)  Stress Test Technologist: Milana Na, EMT-P  Nuclear Technologist:  Domenic Polite, CNMT     Rest Procedure:  Myocardial perfusion imaging was performed at rest 45 minutes following the intravenous administration of Technetium 2m  Tetrofosmin. Rest ECG: NSR - Normal EKG  Stress Procedure:  The patient exercised for 9:00.  The patient stopped due to fatigue and denied any chest pain.  There were non specific ST-T wave changes.  Technetium 4m Tetrofosmin was injected at peak exercise and myocardial perfusion imaging was performed after a brief delay. Stress ECG: No significant change from baseline ECG  QPS Raw Data Images:  Normal; no motion artifact; normal heart/lung ratio. Stress Images:  Normal homogeneous uptake in all areas of the myocardium. Rest Images:  Normal homogeneous uptake in all areas of the myocardium. Subtraction (SDS):  No evidence of ischemia. Transient Ischemic Dilatation (Normal <1.22):  1.08 Lung/Heart Ratio (Normal <0.45):  0.32  Quantitative Gated Spect Images QGS EDV:  81 ml QGS ESV:  27 ml QGS cine images:  NL LV Function; NL Wall Motion QGS EF: 67%  Impression Exercise Capacity:  Excellent exercise capacity. BP Response:  Hypertensive blood pressure response. Clinical Symptoms:  No chest pain. ECG Impression:  No significant ST segment change suggestive of ischemia. Comparison with Prior Nuclear Study: No significant change from previous study  Overall Impression:  Normal stress nuclear study.  There is no evidence of ischemia.  The LV function is normal.     Elyn Aquas., MD, [facility]

## 2010-06-15 NOTE — Progress Notes (Addendum)
Copy routed to Dr. Excell Seltzer.[name]  Normal study noted.

## 2010-06-20 ENCOUNTER — Ambulatory Visit (INDEPENDENT_AMBULATORY_CARE_PROVIDER_SITE_OTHER): Payer: 59 | Admitting: Cardiovascular Disease

## 2010-06-20 DIAGNOSIS — E785 Hyperlipidemia, unspecified: Secondary | ICD-10-CM

## 2010-06-20 DIAGNOSIS — I2510 Atherosclerotic heart disease of native coronary artery without angina pectoris: Secondary | ICD-10-CM

## 2010-06-20 DIAGNOSIS — I10 Essential (primary) hypertension: Secondary | ICD-10-CM

## 2010-06-20 NOTE — Patient Instructions (Signed)
Your physician wants you to follow-up in: ONE YEAR You will receive a reminder letter in the mail two months in advance. If you don't receive a letter, please call our office to schedule the follow-up appointment.   STOP PLAVIX AFTER CURRENT SCRIPT  Your physician recommends that you return for a FASTING lipid profile: SEPT 2012

## 2010-06-23 ENCOUNTER — Encounter: Payer: Self-pay | Admitting: Cardiovascular Disease

## 2010-06-23 NOTE — Progress Notes (Signed)
HPI:  This is a 65 year old woman up of coronary artery disease. She has been followed by Dr. Dickie La over the last several years. She initially presented with an anterior MI and was treated with drug-eluting stent to the LAD. The patient is doing well at present. She had some neck and chest discomfort in May of this year and was evaluated by Tereso Newcomer. She underwent an exercise Myoview stress scan demonstrating excellent exercise capacity, normal nuclear imaging, and normal left ventricular function.  The patient's symptoms have resolved. She denies chest pain, dyspnea, or palpitations. She has no complaints today. She exercises regularly.  Outpatient Encounter Prescriptions as of 06/20/2010  Medication Sig Dispense Refill  . B Complex-C (SUPER B COMPLEX PO) Take by mouth daily.        Marland Kitchen ezetimibe-simvastatin (VYTORIN) 10-40 MG per tablet Take 1 tablet by mouth at bedtime.        . nitroGLYCERIN (NITROSTAT) 0.4 MG SL tablet Place 0.4 mg under the tongue every 5 (five) minutes as needed.        Marland Kitchen olmesartan (BENICAR) 20 MG tablet Take 20 mg by mouth daily.        Marland Kitchen DISCONTD: clopidogrel (PLAVIX) 75 MG tablet Take 75 mg by mouth daily.        Marland Kitchen aspirin 81 MG EC tablet Take 81 mg by mouth daily.          No Known Allergies  Past Medical History  Diagnosis Date  . Coronary artery disease 2005    a. s/p Ant STEMI 10/05 tx with Taxus DES to LAD;  b. cath 10/05: LM ok, LAD 99% tx with PCI, CFX ok, pRCA 50%, EF 30%;  c. echo 10/05: EF 55-60%, apical septal HK;  d. myoview 4/07: no scar or ischemia  . Hyperlipidemia   . Hypertension     ROS: Negative except as per HPI  BP 120/80  Pulse 53  Ht 5\' 3"  (1.6 m)  Wt 129 lb (58.514 kg)  BMI 22.85 kg/m2  PHYSICAL EXAM: Pt is alert and oriented, NAD HEENT: normal Neck: JVP - normal, carotids 2+= without bruits Lungs: CTA bilaterally CV: RRR without murmur or gallop Abd: soft, NT, Positive BS, no hepatomegaly Ext: no C/C/E, distal pulses intact  and equal Skin: warm/dry no rash  ASSESSMENT AND PLAN:

## 2010-06-23 NOTE — Assessment & Plan Note (Signed)
The patient is stable without angina. Her Myoview scan was normal and I reviewed this with her today. She would like to come off of clopidogrel and I advised that this would be acceptable to do. The patient has been on dual antiplatelet therapy for nearly 7 years. We discussed the risk benefit ratio of continued Plavix and we both agree that her risk profile is favorable for discontinuing this drug.

## 2010-06-23 NOTE — Assessment & Plan Note (Signed)
The patient is on statin therapy. She has an excellent lipid panel with HDL of 71 and LDL 71

## 2010-06-23 NOTE — Assessment & Plan Note (Signed)
She appears to have excellent control of her blood pressure. She brought him home readings and I reviewed these today. Her average blood pressures in the 120s over 70s.

## 2010-06-29 NOTE — Progress Notes (Signed)
This pt was seen by Dr Excell Seltzer 06/20/10 in follow-up.

## 2010-09-06 ENCOUNTER — Other Ambulatory Visit: Payer: Self-pay | Admitting: *Deleted

## 2010-09-06 MED ORDER — OLMESARTAN MEDOXOMIL 20 MG PO TABS: 20.0000 mg | ORAL_TABLET | Freq: Every day | ORAL | Status: DC

## 2010-09-06 MED ORDER — EZETIMIBE-SIMVASTATIN 10-40 MG PO TABS: 1.0000 | ORAL_TABLET | Freq: Every day | ORAL | Status: DC

## 2011-02-28 ENCOUNTER — Other Ambulatory Visit: Payer: Self-pay | Admitting: Cardiovascular Disease

## 2011-02-28 MED ORDER — OLMESARTAN MEDOXOMIL 20 MG PO TABS: 20.0000 mg | ORAL_TABLET | Freq: Every day | ORAL | Status: DC

## 2011-02-28 MED ORDER — EZETIMIBE-SIMVASTATIN 10-40 MG PO TABS: 1.0000 | ORAL_TABLET | Freq: Every day | ORAL | Status: DC

## 2011-02-28 NOTE — Telephone Encounter (Signed)
New Refill   Patient needs refill on RX:   ezetimibe-simvastatin (VYTORIN) 10-40 MG per tablet   olmesartan (BENICAR) 20 MG tablet  Pharmacy has changed to [employer] order   Patient can be reached at [phone], should you have any additional questions

## 2011-08-20 ENCOUNTER — Telehealth: Payer: Self-pay | Admitting: Cardiovascular Disease

## 2011-08-20 DIAGNOSIS — E7800 Pure hypercholesterolemia, unspecified: Secondary | ICD-10-CM

## 2011-08-20 NOTE — Telephone Encounter (Signed)
The pt's insurance plan changed and now Vytorin cost almost $500/3 month supply.  The pt has not had her cholesterol checked in a few years.  I made her aware that she needs to have labs checked before Dr Excell Seltzer can make a change in her cholesterol medication.  Pt agreed with plan and will come into the office tomorrow for labs.

## 2011-08-20 NOTE — Telephone Encounter (Signed)
New problem:  Patient calling need vytorin to changed to different meds due to cost is high.

## 2011-08-21 ENCOUNTER — Other Ambulatory Visit: Payer: 59

## 2011-08-22 ENCOUNTER — Other Ambulatory Visit (INDEPENDENT_AMBULATORY_CARE_PROVIDER_SITE_OTHER): Payer: 59

## 2011-08-22 DIAGNOSIS — E7800 Pure hypercholesterolemia, unspecified: Secondary | ICD-10-CM

## 2011-08-22 LAB — BASIC METABOLIC PANEL
BUN: 15 mg/dL (ref 6–23)
CO2: 30 mEq/L (ref 19–32)
Calcium: 9.1 mg/dL (ref 8.4–10.5)
Chloride: 96 mEq/L (ref 96–112)
Creatinine, Ser: 0.7 mg/dL (ref 0.4–1.2)
GFR: 87.92 mL/min (ref >60.00)
Glucose, Bld: 95 mg/dL (ref 70–99)
Potassium: 4.5 mEq/L (ref 3.5–5.1)
Sodium: 133 mEq/L — ABNORMAL LOW (ref 135–145)

## 2011-08-22 LAB — HEPATIC FUNCTION PANEL
ALT: 21 U/L (ref 0–35)
AST: 24 U/L (ref 0–37)
Albumin: 4.2 g/dL (ref 3.5–5.2)
Alkaline Phosphatase: 50 U/L (ref 39–117)
Bilirubin, Direct: 0.1 mg/dL (ref 0.0–0.3)
Total Bilirubin: 0.8 mg/dL (ref 0.3–1.2)
Total Protein: 6.6 g/dL (ref 6.0–8.3)

## 2011-08-22 LAB — LIPID PANEL
Cholesterol: 157 mg/dL (ref 0–200)
HDL: 91.1 mg/dL (ref >39.00)
LDL Cholesterol: 58 mg/dL (ref 0–99)
Total CHOL/HDL Ratio: 2
Triglycerides: 40 mg/dL (ref 0.0–149.0)
VLDL: 8 mg/dL (ref 0.0–40.0)

## 2011-08-29 ENCOUNTER — Telehealth: Payer: Self-pay | Admitting: Cardiovascular Disease

## 2011-08-29 NOTE — Telephone Encounter (Signed)
Please return call to patient [phone] concerning medication change.

## 2011-08-29 NOTE — Telephone Encounter (Signed)
This patient is currently taking Vytorin and cannot afford to remain on this medication due to cost. I will have Dr Excell Seltzer make further recommendations about statin drug. Pt just had recent labs drawn.

## 2011-08-30 MED ORDER — SIMVASTATIN 40 MG PO TABS: 40.0000 mg | ORAL_TABLET | Freq: Every day | ORAL | Status: DC

## 2011-08-30 NOTE — Telephone Encounter (Signed)
Would change to simvastatin 40 mg daily. thx (Note from Dr Excell Seltzer)

## 2011-08-30 NOTE — Telephone Encounter (Signed)
I spoke with the pt and made her aware of medication change.  Rx sent to pharmacy per pt's request.

## 2011-08-31 ENCOUNTER — Telehealth: Payer: Self-pay | Admitting: Cardiovascular Disease

## 2011-08-31 NOTE — Telephone Encounter (Signed)
New msg Pharmacy wants to verify simvastatin rx ref number [id number]

## 2011-09-04 NOTE — Telephone Encounter (Signed)
[facility] call [facility] wants to discuss zocor-simvastatin drug therapy question ref number [id number]

## 2011-09-04 NOTE — Telephone Encounter (Signed)
I spoke with Nida Boatman the pharmacist and he wanted to clarify that the pt will be discontinuing Vytorin and starting Simvastatin.  I made him aware that this is correct.

## 2011-09-27 ENCOUNTER — Encounter: Payer: Self-pay | Admitting: Cardiovascular Disease

## 2011-09-27 ENCOUNTER — Ambulatory Visit (INDEPENDENT_AMBULATORY_CARE_PROVIDER_SITE_OTHER): Payer: 59 | Admitting: Cardiovascular Disease

## 2011-09-27 VITALS — BP 140/77 | HR 51 | Ht 63.0 in | Wt 129.8 lb

## 2011-09-27 DIAGNOSIS — I2510 Atherosclerotic heart disease of native coronary artery without angina pectoris: Secondary | ICD-10-CM

## 2011-09-27 DIAGNOSIS — I10 Essential (primary) hypertension: Secondary | ICD-10-CM

## 2011-09-27 DIAGNOSIS — E785 Hyperlipidemia, unspecified: Secondary | ICD-10-CM

## 2011-09-27 NOTE — Assessment & Plan Note (Signed)
The patient is stable without anginal symptoms. She is on appropriate medical program with aspirin, statin drug, and Benicar. We'll continue the same. I would like to see her back in one year.

## 2011-09-27 NOTE — Patient Instructions (Addendum)
Your physician wants you to follow-up in: 1 YEAR with Dr Excell Seltzer.  You will receive a reminder letter in the mail two months in advance. If you don't receive a letter, please call our office to schedule the follow-up appointment.  Your physician recommends that you return for a FASTING BMP, LIPID and LIVER in 1 YEAR--nothing to eat or drink after midnight, lab opens at 8:30  Your physician recommends that you continue on your current medications as directed. Please refer to the Current Medication list given to you today.

## 2011-09-27 NOTE — Assessment & Plan Note (Signed)
Lipids reviewed and they are excellent. Her HDL is very high. Her LDL is less than 70. Continue simvastatin and followup lipids and LFTs in one year.

## 2011-09-27 NOTE — Progress Notes (Signed)
   HPI:  65 year old woman presenting for followup evaluation. The patient has coronary artery disease with history of an anterior myocardial infarction treated with drug-eluting stent to the LAD. Her last nuclear scan was in 2012 when she demonstrated good exercise capacity with normal myocardial perfusion and normal LV function. Lipids were last checked 08/22/2011. Total cholesterol is 157, HDL 91, LDL 58, triglycerides 40. Plavix was stopped at her appointment last year and she continues on antiplatelet therapy with aspirin.  The patient has been feeling well. She's been under a lot of stress at home in feels occasional tension in her jaw. The symptoms at the time of her myocardial infarction or chest pain and pressure. She has not had any chest pain, chest pressure, dyspnea, palpitations, edema, lightheadedness, or syncope. She is physically active and has no exertional symptoms.  Outpatient Encounter Prescriptions as of 09/27/2011  Medication Sig Dispense Refill  . aspirin 81 MG EC tablet Take 81 mg by mouth daily.        . B Complex-C (SUPER B COMPLEX PO) Take by mouth daily.        . nitroGLYCERIN (NITROSTAT) 0.4 MG SL tablet Place 0.4 mg under the tongue every 5 (five) minutes as needed.        Marland Kitchen olmesartan (BENICAR) 20 MG tablet Take 1 tablet (20 mg total) by mouth daily.  90 tablet  2  . simvastatin (ZOCOR) 40 MG tablet Take 1 tablet (40 mg total) by mouth at bedtime.  90 tablet  3    No Known Allergies  Past Medical History  Diagnosis Date  . Coronary artery disease 2005    a. s/p Ant STEMI 10/05 tx with Taxus DES to LAD;  b. cath 10/05: LM ok, LAD 99% tx with PCI, CFX ok, pRCA 50%, EF 30%;  c. echo 10/05: EF 55-60%, apical septal HK;  d. myoview 4/07: no scar or ischemia  . Hyperlipidemia   . Hypertension     ROS: Negative except as per HPI  BP 140/77  Pulse 51  Ht 5\' 3"  (1.6 m)  Wt 58.877 kg (129 lb 12.8 oz)  BMI 22.99 kg/m2  PHYSICAL EXAM: Pt is alert and oriented,  NAD HEENT: normal Neck: JVP - normal, carotids 2+= without bruits Lungs: CTA bilaterally CV: RRR without murmur or gallop Abd: soft, NT, Positive BS, no hepatomegaly Ext: no C/C/E, distal pulses intact and equal Skin: warm/dry no rash  EKG:  Sinus bradycardia 51 beats per minute, otherwise within normal limits.  ASSESSMENT AND PLAN:

## 2011-09-27 NOTE — Assessment & Plan Note (Signed)
Blood pressure borderline today. Home readings have been lower. Continue same medication.

## 2011-11-23 ENCOUNTER — Encounter: Payer: Self-pay | Admitting: Internal Medicine

## 2011-11-30 ENCOUNTER — Telehealth: Payer: Self-pay | Admitting: Cardiovascular Disease

## 2011-11-30 MED ORDER — OLMESARTAN MEDOXOMIL 20 MG PO TABS: 20.0000 mg | ORAL_TABLET | Freq: Every day | ORAL | Status: DC

## 2011-11-30 NOTE — Telephone Encounter (Signed)
lmom RX  Sent into pharmacy. CTuck

## 2011-11-30 NOTE — Telephone Encounter (Signed)
benecar 20mg   refill needed [facility] 90 day supply , 3 refills

## 2012-03-27 ENCOUNTER — Telehealth: Payer: Self-pay | Admitting: Cardiovascular Disease

## 2012-03-27 NOTE — Telephone Encounter (Signed)
New Problem:    Patient called in having some chest pains and did not know if it was indigestion.  Would like to be seen today.  Please call back.

## 2012-03-27 NOTE — Telephone Encounter (Signed)
I spoke with the pt and for the past few days she has had chest and back pain off and on but very steady and intestinal problems . The pt cannot determine if this is cardiac or indigestion.  The pt has used NTG and antiacids for symptoms and both have provided some relief but the symptoms return. The pt states these symptoms are not similar to what she felt prior to MI.  I do not have any openings today so I have scheduled the pt to see Dr Excell Seltzer tomorrow.  I did advise the pt that she should proceed to the ER with any worsening of symptoms.  Pt agreed with plan.

## 2012-03-28 ENCOUNTER — Encounter: Payer: Self-pay | Admitting: Cardiovascular Disease

## 2012-03-28 ENCOUNTER — Ambulatory Visit (INDEPENDENT_AMBULATORY_CARE_PROVIDER_SITE_OTHER): Payer: 59 | Admitting: Cardiovascular Disease

## 2012-03-28 VITALS — BP 148/84 | HR 53 | Ht 63.0 in | Wt 131.8 lb

## 2012-03-28 DIAGNOSIS — I10 Essential (primary) hypertension: Secondary | ICD-10-CM

## 2012-03-28 DIAGNOSIS — E785 Hyperlipidemia, unspecified: Secondary | ICD-10-CM

## 2012-03-28 LAB — TROPONIN I: Troponin I: <0.3 ng/mL (ref <0.30)

## 2012-03-28 NOTE — Patient Instructions (Addendum)
Your physician recommends that you have lab work today: Troponin  Your physician recommends that you schedule a follow-up appointment in: September  Your physician recommends that you continue on your current medications as directed. Please refer to the Current Medication list given to you today.

## 2012-03-28 NOTE — Progress Notes (Signed)
HPI:  67 year old woman presenting for followup evaluation. The patient has coronary artery disease. She initially presented with an anterior wall MI in 2005. She was treated with a drug-eluting stent in the LAD. She's had no recurrent ischemic event since her initial heart attack in 2005. Last labs were in August 2013 with a cholesterol of 157, HDL 91 and LDL 58.  She comes in today because of symptoms she's had over the course of this week. She's had about 3 days of migrating pain in the chest, abdomen, back, and left forearm. The pain has been nearly constant and she describes it as a dull ache. She's also had some sharp pains in the chest. This is been associated with gastrointestinal symptoms. She's had a lot of belching and this is provided relief of her symptoms. She had some constipation and moved her bowels yesterday. Since that time her symptoms have been completely resolved. She was initially concerned about cardiac symptoms but now feels like all of this was probably a gastrointestinal illness. She has no shortness of breath, edema, palpitations, or other complaints. She feels back to her baseline this morning.  Outpatient Encounter Prescriptions as of 03/28/2012  Medication Sig Dispense Refill  . B Complex-C (SUPER B COMPLEX PO) Take by mouth daily.        . nitroGLYCERIN (NITROSTAT) 0.4 MG SL tablet Place 0.4 mg under the tongue every 5 (five) minutes as needed.        Marland Kitchen olmesartan (BENICAR) 20 MG tablet Take 1 tablet (20 mg total) by mouth daily.  90 tablet  2  . simvastatin (ZOCOR) 40 MG tablet Take 1 tablet (40 mg total) by mouth at bedtime.  90 tablet  3  . [DISCONTINUED] aspirin 81 MG EC tablet Take 81 mg by mouth daily.        . [DISCONTINUED] benzonatate (TESSALON) 100 MG capsule        No facility-administered encounter medications on file as of 03/28/2012.    No Known Allergies  Past Medical History  Diagnosis Date  . Coronary artery disease 2005    a. s/p Ant STEMI  10/05 tx with Taxus DES to LAD;  b. cath 10/05: LM ok, LAD 99% tx with PCI, CFX ok, pRCA 50%, EF 30%;  c. echo 10/05: EF 55-60%, apical septal HK;  d. myoview 4/07: no scar or ischemia  . Hyperlipidemia   . Hypertension     ROS: Negative except as per HPI  Ht 5\' 3"  (1.6 m)  Wt 59.784 kg (131 lb 12.8 oz)  BMI 23.35 kg/m2  PHYSICAL EXAM: Pt is alert and oriented, NAD HEENT: normal Neck: JVP - normal, carotids 2+= without bruits Lungs: CTA bilaterally CV: RRR without murmur or gallop Abd: soft, NT, Positive BS, no hepatomegaly Ext: no C/C/E, distal pulses intact and equal Skin: warm/dry no rash  EKG:  Sinus bradycardia 53 beats per minute, otherwise within normal limits.  ASSESSMENT AND PLAN: 1. Chest pain with typical and atypical features. I think this was likely due to the gastrointestinal illness based on the patient's symptoms, which now have resolved. Because she had a few days of near constant pain, I am going to check a troponin level just to make sure this was not an atypical cardiac presentation. I don't think we need to proceed with stress testing at this time, but if she has recurrence of her symptoms I would like her to call back and we will move forward with a nuclear perfusion scan  considering her known CAD.  2. Hyperlipidemia. Lipids are well controlled. Will repeat this summer before her office visit in the fall.  3. Hypertension. The patient is on Benicar. Blood pressure mildly elevated today. We'll continue to follow.  Tonny Bollman 03/28/2012 10:30 AM

## 2012-03-30 ENCOUNTER — Encounter: Payer: Self-pay | Admitting: Cardiovascular Disease

## 2012-07-30 ENCOUNTER — Encounter: Payer: Self-pay | Admitting: Internal Medicine

## 2012-08-21 ENCOUNTER — Other Ambulatory Visit: Payer: Self-pay

## 2012-08-21 MED ORDER — OLMESARTAN MEDOXOMIL 20 MG PO TABS: 20.0000 mg | ORAL_TABLET | Freq: Every day | ORAL | Status: DC

## 2012-08-21 MED ORDER — SIMVASTATIN 40 MG PO TABS: 40.0000 mg | ORAL_TABLET | Freq: Every day | ORAL | Status: DC

## 2013-01-21 ENCOUNTER — Encounter: Payer: Self-pay | Admitting: Internal Medicine

## 2013-02-20 ENCOUNTER — Encounter: Payer: Self-pay | Admitting: Internal Medicine

## 2013-02-20 ENCOUNTER — Ambulatory Visit (INDEPENDENT_AMBULATORY_CARE_PROVIDER_SITE_OTHER): Payer: 59 | Admitting: Internal Medicine

## 2013-02-20 VITALS — BP 116/60 | HR 64 | Ht 62.0 in | Wt 130.0 lb

## 2013-02-20 DIAGNOSIS — R142 Eructation: Secondary | ICD-10-CM

## 2013-02-20 DIAGNOSIS — R143 Flatulence: Secondary | ICD-10-CM

## 2013-02-20 DIAGNOSIS — R140 Abdominal distension (gaseous): Secondary | ICD-10-CM

## 2013-02-20 DIAGNOSIS — R141 Gas pain: Secondary | ICD-10-CM

## 2013-02-20 DIAGNOSIS — Z1211 Encounter for screening for malignant neoplasm of colon: Secondary | ICD-10-CM

## 2013-02-20 NOTE — Patient Instructions (Signed)
Your physician has requested that you go to the basement for the following lab work before leaving today: Stool study  It has been recommended to you by your physician that you have a(n) colonoscopy completed. Per your request, we did not schedule the procedure(s) today. Please contact our office at [phone] should you decide to have the procedure completed.  Stay on your probiotic and follow up with us as needed.   I appreciate the opportunity to care for you.

## 2013-02-20 NOTE — Progress Notes (Signed)
Subjective:    Patient ID: [name], female    DOB: 07-20-1945, 68 y.o.   MRN: [id number]  HPI Is a very nice lady I know from previous colonoscopy for screening in 2003, who presents with complaints of left infrascapular pain. She felt terrible and the fall with aches and pains, diffuse feeling of being hot all over her body and then had a diagnosis of Lyme disease made. She was treated with doxycycline and felt better. She had recurrent symptoms and doxycycline was represcribed. During the second course of treatment she developed odynophagia and burning esophageal pain. It eventually was altered spontaneously after stopping doxycycline. Along with his other symptoms she was having belching and burping in his left infrascapular discomfort she thought was related to gas. She denies any bowel habit changes there is been no unintentional weight loss or clear dysphagia though foods may move down slowly at times. Every time she took doxycycline these bloating and gas symptoms and left infrascapular pain resolved. Since that time she has started on a probiotic twice a day and says that that has relieved these bloating gas and belching and left infrascapular pain symptoms. No Known Allergies Outpatient Prescriptions Prior to Visit  Medication Sig Dispense Refill  . B Complex-C (SUPER B COMPLEX PO) Take by mouth daily.        . nitroGLYCERIN (NITROSTAT) 0.4 MG SL tablet Place 0.4 mg under the tongue every 5 (five) minutes as needed.        Marland Kitchen olmesartan (BENICAR) 20 MG tablet Take 1 tablet (20 mg total) by mouth daily.  90 tablet  2  . simvastatin (ZOCOR) 40 MG tablet Take 1 tablet (40 mg total) by mouth at bedtime.  90 tablet  3   No facility-administered medications prior to visit.   Past Medical History  Diagnosis Date  . Coronary artery disease 2005    a. s/p Ant STEMI 10/05 tx with Taxus DES to LAD;  b. cath 10/05: LM ok, LAD 99% tx with PCI, CFX ok, pRCA 50%, EF 30%;  c. echo  10/05: EF 55-60%, apical septal HK;  d. myoview 4/07: no scar or ischemia  . Hyperlipidemia   . Hypertension   . Diverticulosis    Past Surgical History  Procedure Laterality Date  . Cardiac catheterization  2005    TAXUS STENT LEFT ANTERIOR DESCENDING  . Thumb arthroscopy Right   . Colonoscopy  12/22/2001    Dr. Stan Head   History   Social History  . Marital Status: Married    Spouse Name: N/A    Number of Children: 0  .     Occupational History  . COMPUTER WORKER   .     Social History Main Topics  . Smoking status: Former Smoker -- 0.50 packs/day    Types: Cigarettes    Quit date: 01/15/1986  . Smokeless tobacco: Never Used  . Alcohol Use: 1.2 oz/week    1 Glasses of wine, 1 Cans of beer per week     Comment: BEER OR A GLASS OF WINE MOST NIGHTS  . Drug Use: No  .     Other Topics Concern  . None   Social History Narrative   LIVES IN [place], [place] W/SPOUSE   COMPUTER WORKER   NO CHILDREN   SMOKING HX .Marland KitchenMarland KitchenQUIT 20-25 YRS AGO SMOKED .05 PPD OR LESS   GLASS OF WINE OR BEER MOST NIGHTS   CONTINUES TO DO CARDIOVASCULAR  WORKOUT, GOLFING AND RIDING HORSES.   STATES FAIRLY HEALTHY DIET   HERBAL MEDICATIONS INCLUDE/ OMEGA 3    Family History  Problem Relation Age of Onset  . Heart disease Mother   . Pancreatic cancer Father   . Heart disease Brother 40    HAD 3 MI's THAT STARTED IN HIS 40's HE IS NOW 59  . Coronary artery disease Sister    Review of Systems As per history of present illness also has some back pain. All other review of systems are negative.    Objective:   Physical Exam General:  Well-developed, well-nourished and in no acute distress Eyes:  anicteric. ENT:   Mouth and posterior pharynx free of lesions.  Neck:   supple w/o thyromegaly or mass.  Lungs: Clear to auscultation bilaterally. Heart:  S1S2, no rubs, murmurs, gallops. Abdomen:  soft, non-tender, no hepatosplenomegaly, hernia, or mass and BS+.  Lymph:  no cervical or  supraclavicular adenopathy. Extremities:   no edema Psych:  appropriate mood and  Affect.    Assessment & Plan:   1. Bloating   2. Belching   3. Special screening for malignant neoplasms, colon    Cause of her symptoms is not clear but certainly sounds like it could be an IBS problem with fermentation by bacteria and perhaps even small intestinal bacterial overgrowth. She seems to have responded to probiotic therapy. The response to antibiotics goes along with this idea that she has some bacterial overgrowth causing her problems. I think the odynophagia and chest pain symptoms she had a second course of doxycycline was probably pill esophagitis and/or ulcer.   She is overdue for a repeat colonoscopy but she declines colonoscopy because of the prep, she does not like it and does not want to pursue it.  1. She will continue her probiotic 2. Followup as needed, if they're warning signs such as weight loss or clear-cut dysphagia will reassess. I did discuss the possibility of upper endoscopy and or pursuing barium x-rays but she did not want to do this and I don't think it's absolutely necessary. 3. Keep in mind that Benicar has been associated with a sprue-like illness though the patient's I have seen have a lot of diarrhea and then more ill than she has been and do not respond her probiotics so I doubt that the cause but I think it's worth it to file this for future reference if needed 4. She is agreed to perform an immune fecal occult blood test in lieu of colonoscopy at this time for colon cancer screening and agrees to proceed with a colonoscopy if this is positive.  I appreciate the opportunity to care for this patient. CC: Beverley FiedlerANKINS,VICTORIA, MD

## 2013-04-06 ENCOUNTER — Other Ambulatory Visit: Payer: Self-pay | Admitting: Cardiovascular Disease

## 2013-04-07 ENCOUNTER — Telehealth: Payer: Self-pay | Admitting: *Deleted

## 2013-04-07 NOTE — Telephone Encounter (Signed)
Patient needs appointment with Dr Excell Seltzerooper. Thanks, MI

## 2013-04-28 ENCOUNTER — Other Ambulatory Visit: Payer: Self-pay | Admitting: Cardiovascular Disease

## 2013-05-19 ENCOUNTER — Encounter: Payer: Self-pay | Admitting: Nurse Practitioner

## 2013-05-19 ENCOUNTER — Ambulatory Visit (INDEPENDENT_AMBULATORY_CARE_PROVIDER_SITE_OTHER): Payer: 59 | Admitting: Nurse Practitioner

## 2013-05-19 VITALS — BP 130/80 | HR 57 | Ht 63.0 in | Wt 132.4 lb

## 2013-05-19 DIAGNOSIS — E7800 Pure hypercholesterolemia, unspecified: Secondary | ICD-10-CM

## 2013-05-19 DIAGNOSIS — I2510 Atherosclerotic heart disease of native coronary artery without angina pectoris: Secondary | ICD-10-CM

## 2013-05-19 DIAGNOSIS — I10 Essential (primary) hypertension: Secondary | ICD-10-CM

## 2013-05-19 NOTE — Patient Instructions (Addendum)
Continue with your current medicines but I do recommend adding aspirin 81 mg each day to your regimen.   Call us when you need your refills  We will check fasting labs in the next 1 to 2 weeks at your convenience  See Dr. Excell Seltzerooper in a year  Call the [facility] office at [phone] if you have any questions, problems or concerns.

## 2013-05-19 NOTE — Progress Notes (Signed)
Renae FickleSheila C Ram Date of Birth: August 30, 1945 Medical Record #[id number]  History of Present Illness: Ms. Ephriam KnucklesBaranello is seen back today for a follow up visit. Seen for Dr. Excell Seltzerooper. She is a 68 year old female with known CAD, prior anterior MI in 2005 treated with DES to the LAD. Other issues include HLD, HTN and diverticulosis. Normal Myoview back in 2012.   Last seen here back in March for some atypical symptoms. Troponin negative.   Comes back today. Here alone. Doing well. No symptoms whatsoever. No chest pain. Not short of breath. BP good at home. Remains active. She is cutting her hours at work as of June - switching to Medicare - will need refills then. She is not fasting today. She no longer takes aspirin - not clear as to why.   Current Outpatient Prescriptions  Medication Sig Dispense Refill  . AMBULATORY NON FORMULARY MEDICATION Digestive Advantage 1 tablet by mouth 1-2 times per day      . B Complex-C (SUPER B COMPLEX PO) Take by mouth daily.        Marland Kitchen. BENICAR 20 MG tablet Take 1 tablet by mouth  daily  90 tablet  0  . ibuprofen (ADVIL,MOTRIN) 200 MG tablet Take 200 mg by mouth at bedtime.      . naproxen sodium (ALEVE) 220 MG tablet Take 220 mg by mouth every morning.      . nitroGLYCERIN (NITROSTAT) 0.4 MG SL tablet Place 0.4 mg under the tongue every 5 (five) minutes as needed.        . simvastatin (ZOCOR) 40 MG tablet Take 1 tablet (40 mg total) by mouth at bedtime.  90 tablet  3   No current facility-administered medications for this visit.    No Known Allergies  Past Medical History  Diagnosis Date  . Coronary artery disease 2005    a. s/p Ant STEMI 10/05 tx with Taxus DES to LAD;  b. cath 10/05: LM ok, LAD 99% tx with PCI, CFX ok, pRCA 50%, EF 30%;  c. echo 10/05: EF 55-60%, apical septal HK;  d. myoview 4/07: no scar or ischemia  . Hyperlipidemia   . Hypertension   . Diverticulosis     Past Surgical History  Procedure Laterality Date  . Cardiac catheterization   2005    TAXUS STENT LEFT ANTERIOR DESCENDING  . Thumb arthroscopy Right   . Colonoscopy  12/22/2001    Dr. Stan Headarl Gessner    History  Smoking status  . Former Smoker -- 0.50 packs/day  . Types: Cigarettes  . Quit date: 01/15/1986  Smokeless tobacco  . Never Used    History  Alcohol Use  . 1.2 oz/week  . 1 Glasses of wine, 1 Cans of beer per week    Comment: BEER OR A GLASS OF WINE MOST NIGHTS    Family History  Problem Relation Age of Onset  . Heart disease Mother   . Pancreatic cancer Father   . Heart disease Brother 40    HAD 3 MI's THAT STARTED IN HIS 40's HE IS NOW 59  . Coronary artery disease Sister     Review of Systems: The review of systems is per the HPI.  All other systems were reviewed and are negative.  Physical Exam: BP 130/80  Pulse 57  Ht 5\' 3"  (1.6 m)  Wt 132 lb 6.4 oz (60.056 kg)  BMI 23.46 kg/m2  SpO2 99% Patient is very pleasant and in no acute distress. Skin is warm and  dry. Color is normal.  HEENT is unremarkable. Normocephalic/atraumatic. PERRL. Sclera are nonicteric. Neck is supple. No masses. No JVD. Lungs are clear. Cardiac exam shows a regular rate and rhythm. Abdomen is soft. Extremities are without edema. Gait and ROM are intact. No gross neurologic deficits noted.  LABORATORY DATA:   Lab Results  Component Value Date   WBC 5.5 05/30/2010   HGB 13.5 05/30/2010   HCT 39.5 05/30/2010   PLT 196.0 05/30/2010   GLUCOSE 95 08/22/2011   CHOL 157 08/22/2011   TRIG 40.0 08/22/2011   HDL 91.10 08/22/2011   LDLCALC 58 08/22/2011   ALT 21 08/22/2011   AST 24 08/22/2011   NA 133* 08/22/2011   K 4.5 08/22/2011   CL 96 08/22/2011   CREATININE 0.7 08/22/2011   BUN 15 08/22/2011   CO2 30 08/22/2011   TSH 1.00 05/30/2010   Myoview Impression from 2012  Exercise Capacity: Excellent exercise capacity.  BP Response: Hypertensive blood pressure response.  Clinical Symptoms: No chest pain.  ECG Impression: No significant ST segment change suggestive of ischemia.    Comparison with Prior Nuclear Study: No significant change from previous study  Overall Impression: Normal stress nuclear study. There is no evidence of ischemia. The LV function is normal.  Elyn AquasPhilip Nahser Jr., MD, [facility]    Assessment / Plan: 1. CAD with past anterior MI with DES to the LAD in 2005 - negative Myoview in 2012 - doing very well clinically. Recommend aspirin 81 mg a day. See back in a year. Continue with CV risk factor modification.  2. HTN - BP great.  3. HLD - recheck fasting labs in the next week or so.  See back in a year.   Patient is agreeable to this plan and will call if any problems develop in the interim.   Rosalio MacadamiaLori C. Yvana Samonte, RN, ANP-C [facility] [address] [place], [place]  [id number] [phone]

## 2013-05-27 ENCOUNTER — Other Ambulatory Visit (INDEPENDENT_AMBULATORY_CARE_PROVIDER_SITE_OTHER): Payer: 59

## 2013-05-27 DIAGNOSIS — I10 Essential (primary) hypertension: Secondary | ICD-10-CM

## 2013-05-27 DIAGNOSIS — E7800 Pure hypercholesterolemia, unspecified: Secondary | ICD-10-CM

## 2013-05-27 DIAGNOSIS — I2510 Atherosclerotic heart disease of native coronary artery without angina pectoris: Secondary | ICD-10-CM

## 2013-05-27 LAB — BASIC METABOLIC PANEL
BUN: 14 mg/dL (ref 6–23)
CO2: 29 mEq/L (ref 19–32)
Calcium: 8.8 mg/dL (ref 8.4–10.5)
Chloride: 104 mEq/L (ref 96–112)
Creatinine, Ser: 0.8 mg/dL (ref 0.4–1.2)
GFR: 82.08 mL/min (ref >60.00)
Glucose, Bld: 93 mg/dL (ref 70–99)
Potassium: 4.7 mEq/L (ref 3.5–5.1)
Sodium: 137 mEq/L (ref 135–145)

## 2013-05-27 LAB — HEPATIC FUNCTION PANEL
ALT: 15 U/L (ref 0–35)
AST: 22 U/L (ref 0–37)
Albumin: 4 g/dL (ref 3.5–5.2)
Alkaline Phosphatase: 54 U/L (ref 39–117)
Bilirubin, Direct: 0.1 mg/dL (ref 0.0–0.3)
Total Bilirubin: 0.7 mg/dL (ref 0.2–1.2)
Total Protein: 5.9 g/dL — ABNORMAL LOW (ref 6.0–8.3)

## 2013-05-27 LAB — LIPID PANEL
Cholesterol: 156 mg/dL (ref 0–200)
HDL: 77.3 mg/dL (ref >39.00)
LDL Cholesterol: 71 mg/dL (ref 0–99)
Total CHOL/HDL Ratio: 2
Triglycerides: 37 mg/dL (ref 0.0–149.0)
VLDL: 7.4 mg/dL (ref 0.0–40.0)

## 2013-06-20 ENCOUNTER — Other Ambulatory Visit: Payer: Self-pay | Admitting: Cardiovascular Disease

## 2013-07-01 ENCOUNTER — Other Ambulatory Visit: Payer: Self-pay | Admitting: Cardiovascular Disease

## 2013-07-21 ENCOUNTER — Other Ambulatory Visit: Payer: Self-pay | Admitting: *Deleted

## 2013-07-21 MED ORDER — OLMESARTAN MEDOXOMIL 20 MG PO TABS: 20.0000 mg | ORAL_TABLET | Freq: Every day | ORAL | Status: DC

## 2013-07-21 MED ORDER — SIMVASTATIN 40 MG PO TABS: 40.0000 mg | ORAL_TABLET | Freq: Every day | ORAL | Status: DC

## 2014-01-20 ENCOUNTER — Other Ambulatory Visit: Payer: Self-pay

## 2014-01-20 MED ORDER — SIMVASTATIN 40 MG PO TABS: 40.0000 mg | ORAL_TABLET | Freq: Every day | ORAL | Status: DC

## 2014-01-20 MED ORDER — OLMESARTAN MEDOXOMIL 20 MG PO TABS: 20.0000 mg | ORAL_TABLET | Freq: Every day | ORAL | Status: DC

## 2014-03-19 DIAGNOSIS — I2510 Atherosclerotic heart disease of native coronary artery without angina pectoris: Secondary | ICD-10-CM | POA: Diagnosis not present

## 2014-03-19 DIAGNOSIS — H6980 Other specified disorders of Eustachian tube, unspecified ear: Secondary | ICD-10-CM | POA: Diagnosis not present

## 2014-03-19 DIAGNOSIS — F458 Other somatoform disorders: Secondary | ICD-10-CM | POA: Diagnosis not present

## 2014-05-20 ENCOUNTER — Ambulatory Visit: Payer: Self-pay | Admitting: Internal Medicine

## 2014-06-01 ENCOUNTER — Ambulatory Visit (INDEPENDENT_AMBULATORY_CARE_PROVIDER_SITE_OTHER): Payer: Medicare Other | Admitting: Nurse Practitioner

## 2014-06-01 ENCOUNTER — Encounter: Payer: Self-pay | Admitting: Nurse Practitioner

## 2014-06-01 VITALS — BP 120/70 | HR 51 | Ht 63.0 in | Wt 126.1 lb

## 2014-06-01 DIAGNOSIS — E7800 Pure hypercholesterolemia, unspecified: Secondary | ICD-10-CM

## 2014-06-01 DIAGNOSIS — I2510 Atherosclerotic heart disease of native coronary artery without angina pectoris: Secondary | ICD-10-CM | POA: Diagnosis not present

## 2014-06-01 LAB — BASIC METABOLIC PANEL
BUN: 13 mg/dL (ref 6–23)
CO2: 34 mEq/L — ABNORMAL HIGH (ref 19–32)
Calcium: 9 mg/dL (ref 8.4–10.5)
Chloride: 101 mEq/L (ref 96–112)
Creatinine, Ser: 0.77 mg/dL (ref 0.40–1.20)
GFR: 79.38 mL/min (ref >60.00)
Glucose, Bld: 102 mg/dL — ABNORMAL HIGH (ref 70–99)
Potassium: 4.9 mEq/L (ref 3.5–5.1)
Sodium: 136 mEq/L (ref 135–145)

## 2014-06-01 LAB — LIPID PANEL
Cholesterol: 173 mg/dL (ref 0–200)
HDL: 68.8 mg/dL (ref >39.00)
LDL Cholesterol: 88 mg/dL (ref 0–99)
NonHDL: 104.2
Total CHOL/HDL Ratio: 3
Triglycerides: 80 mg/dL (ref 0.0–149.0)
VLDL: 16 mg/dL (ref 0.0–40.0)

## 2014-06-01 LAB — HEPATIC FUNCTION PANEL
ALT: 14 U/L (ref 0–35)
AST: 19 U/L (ref 0–37)
Albumin: 4.1 g/dL (ref 3.5–5.2)
Alkaline Phosphatase: 57 U/L (ref 39–117)
Bilirubin, Direct: 0.1 mg/dL (ref 0.0–0.3)
Total Bilirubin: 0.6 mg/dL (ref 0.2–1.2)
Total Protein: 6.1 g/dL (ref 6.0–8.3)

## 2014-06-01 MED ORDER — SIMVASTATIN 40 MG PO TABS: 40.0000 mg | ORAL_TABLET | Freq: Every day | ORAL | Status: DC

## 2014-06-01 MED ORDER — OLMESARTAN MEDOXOMIL 20 MG PO TABS: 20.0000 mg | ORAL_TABLET | Freq: Every day | ORAL | Status: DC

## 2014-06-01 MED ORDER — ASPIRIN EC 81 MG PO TBEC: 81.0000 mg | DELAYED_RELEASE_TABLET | Freq: Every day | ORAL | Status: AC

## 2014-06-01 MED ORDER — NITROGLYCERIN 0.4 MG SL SUBL: 0.4000 mg | SUBLINGUAL_TABLET | SUBLINGUAL | Status: DC | PRN

## 2014-06-01 NOTE — Progress Notes (Signed)
CARDIOLOGY OFFICE NOTE  Date:  06/01/2014    [name] Date of Birth: 05-Mar-1945 Medical Record #[id number]  PCP:  Beverley FiedlerANKINS,VICTORIA, MD  Cardiologist:  Excell Seltzerooper  Chief Complaint  Patient presents with  . Coronary Artery Disease    FU visit - one year check - seen for Dr. Excell Seltzerooper    History of Present Illness: [name] is a 69 y.o. female who presents today for a one year check. Seen for Dr. Excell Seltzerooper. She has known CAD, prior anterior MI in 2005 treated with DES to the LAD. Other issues include HLD, HTN and diverticulosis. Normal Myoview back in 2012.   Last seen here in May of 2015 - was doing well. I restarted her aspirin therapy.   Comes back today. Here alone. Doing well. No chest pain. Not short of breath. Feels pretty good. Needs fasting labs today. Tolerating her medicines. Needs NTG refilled today. She has retired as of December - enjoying so far. Exercising regularly. Some rare rapid heart beat but nothing bothersome for her. She is pleased with how she is doing. She takes her aspirin sporadically.  Past Medical History  Diagnosis Date  . Coronary artery disease 2005    a. s/p Ant STEMI 10/05 tx with Taxus DES to LAD;  b. cath 10/05: LM ok, LAD 99% tx with PCI, CFX ok, pRCA 50%, EF 30%;  c. echo 10/05: EF 55-60%, apical septal HK;  d. myoview 4/07: no scar or ischemia  . Hyperlipidemia   . Hypertension   . Diverticulosis     Past Surgical History  Procedure Laterality Date  . Cardiac catheterization  2005    TAXUS STENT LEFT ANTERIOR DESCENDING  . Thumb arthroscopy Right   . Colonoscopy  12/22/2001    Dr. Stan Headarl Gessner     Medications: Current Outpatient Prescriptions  Medication Sig Dispense Refill  . AMBULATORY NON FORMULARY MEDICATION Digestive Advantage 1 tablet by mouth 1-2 times per day    . B Complex-C (SUPER B COMPLEX PO) Take by mouth daily.      . cetirizine (ZYRTEC) 10 MG tablet Take 10 mg by mouth as needed.   2  . fluticasone  (FLONASE) 50 MCG/ACT nasal spray Place 2 sprays into both nostrils as needed.   2  . ibuprofen (ADVIL,MOTRIN) 200 MG tablet Take 200 mg by mouth at bedtime.    . naproxen sodium (ALEVE) 220 MG tablet Take 220 mg by mouth every morning.    . nitroGLYCERIN (NITROSTAT) 0.4 MG SL tablet Place 1 tablet (0.4 mg total) under the tongue every 5 (five) minutes as needed. 25 tablet 6  . olmesartan (BENICAR) 20 MG tablet Take 1 tablet (20 mg total) by mouth daily. 90 tablet 1  . simvastatin (ZOCOR) 40 MG tablet Take 1 tablet (40 mg total) by mouth daily at 6 PM. 90 tablet 1   No current facility-administered medications for this visit.    Allergies: No Known Allergies  Social History: The patient  reports that she quit smoking about 28 years ago. Her smoking use included Cigarettes. She smoked 0.50 packs per day. She has never used smokeless tobacco. She reports that she drinks about 1.2 oz of alcohol per week. She reports that she does not use illicit drugs.   Family History: The patient's family history includes Coronary artery disease in her sister; Heart disease in her mother; Heart disease (age of onset: 2840) in her brother; Pancreatic cancer in her father.   Review of  Systems: Please see the history of present illness.   All other systems are reviewed and negative.   Physical Exam: VS:  BP 120/70 mmHg  Pulse 51  Ht 5\' 3"  (1.6 m)  Wt 126 lb 1.9 oz (57.208 kg)  BMI 22.35 kg/m2 .  BMI Body mass index is 22.35 kg/(m^2).  Wt Readings from Last 3 Encounters:  06/01/14 126 lb 1.9 oz (57.208 kg)  05/19/13 132 lb 6.4 oz (60.056 kg)  02/20/13 130 lb (58.968 kg)    General: Pleasant. Well developed, well nourished and in no acute distress.  HEENT: Normal. Neck: Supple, no JVD, carotid bruits, or masses noted.  Cardiac: Regular rate and rhythm. No murmurs, rubs, or gallops. No edema.  Respiratory:  Lungs are clear to auscultation bilaterally with normal work of breathing.  GI: Soft and  nontender.  MS: No deformity or atrophy. Gait and ROM intact. Skin: Warm and dry. Color is normal.  Neuro:  Strength and sensation are intact and no gross focal deficits noted.  Psych: Alert, appropriate and with normal affect.   LABORATORY DATA:  EKG:  EKG is ordered today. This demonstrates NSR.  Lab Results  Component Value Date   WBC 5.5 05/30/2010   HGB 13.5 05/30/2010   HCT 39.5 05/30/2010   PLT 196.0 05/30/2010   GLUCOSE 93 05/27/2013   CHOL 156 05/27/2013   TRIG 37.0 05/27/2013   HDL 77.30 05/27/2013   LDLCALC 71 05/27/2013   ALT 15 05/27/2013   AST 22 05/27/2013   NA 137 05/27/2013   K 4.7 05/27/2013   CL 104 05/27/2013   CREATININE 0.8 05/27/2013   BUN 14 05/27/2013   CO2 29 05/27/2013   TSH 1.00 05/30/2010    BNP (last 3 results) No results for input(s): BNP in the last 8760 hours.  ProBNP (last 3 results) No results for input(s): PROBNP in the last 8760 hours.   Other Studies Reviewed Today:   Myoview Impression from 2012  Exercise Capacity: Excellent exercise capacity.  BP Response: Hypertensive blood pressure response.  Clinical Symptoms: No chest pain.  ECG Impression: No significant ST segment change suggestive of ischemia.  Comparison with Prior Nuclear Study: No significant change from previous study  Overall Impression: Normal stress nuclear study. There is no evidence of ischemia. The LV function is normal.  Elyn AquasPhilip Nahser Jr., MD, [facility]    Assessment / Plan: 1. CAD with past anterior MI with DES to the LAD in 2005 - negative Myoview in 2012 - doing very well clinically. Continue with her current regimen. NTG refilled today.  See back in a year. Continue with CV risk factor modification.  2. HTN - BP great. No change in therapy.  3. HLD - on statin therapy - needs labs updated today.  4. Rare palpitation - will have her let us know if this becomes worse or bothersome. She has a resting bradycardia which would limit our therapy.    Current medicines are reviewed with the patient today.  The patient does not have concerns regarding medicines other than what has been noted above.  The following changes have been made:  See above.  Labs/ tests ordered today include:    Orders Placed This Encounter  Procedures  . Basic metabolic panel  . Hepatic function panel  . Lipid panel  . EKG 12-Lead     Disposition:   FU with me in 1 year.   Patient is agreeable to this plan and will call if any problems develop  in the interim.   Signed: Rosalio Macadamia, RN, ANP-C 06/01/2014 9:56 AM  [facility] [address] [place], [place]  [address] Phone: [phone] Fax: [phone]

## 2014-06-01 NOTE — Patient Instructions (Addendum)
We will be checking the following labs today - BMET, Lipids and HPF   Medication Instructions:    Continue with your current medicines.   I have refilled your NTG today    Testing/Procedures To Be Arranged:  N/A  Follow-Up:   I will see you back in one year    Other Special Instructions:   N/A  Call the [facility] office at [phone] if you have any questions, problems or concerns.

## 2014-06-15 ENCOUNTER — Encounter (HOSPITAL_COMMUNITY): Payer: Medicare Other

## 2014-10-26 ENCOUNTER — Other Ambulatory Visit: Payer: Self-pay | Admitting: Cardiovascular Disease

## 2014-11-24 DIAGNOSIS — I10 Essential (primary) hypertension: Secondary | ICD-10-CM | POA: Diagnosis not present

## 2014-11-24 DIAGNOSIS — N6452 Nipple discharge: Secondary | ICD-10-CM | POA: Diagnosis not present

## 2014-11-24 DIAGNOSIS — Z23 Encounter for immunization: Secondary | ICD-10-CM | POA: Diagnosis not present

## 2014-11-24 DIAGNOSIS — N951 Menopausal and female climacteric states: Secondary | ICD-10-CM | POA: Diagnosis not present

## 2014-11-24 DIAGNOSIS — I2510 Atherosclerotic heart disease of native coronary artery without angina pectoris: Secondary | ICD-10-CM | POA: Diagnosis not present

## 2014-11-24 DIAGNOSIS — K219 Gastro-esophageal reflux disease without esophagitis: Secondary | ICD-10-CM | POA: Diagnosis not present

## 2014-12-13 DIAGNOSIS — Z780 Asymptomatic menopausal state: Secondary | ICD-10-CM | POA: Diagnosis not present

## 2014-12-13 DIAGNOSIS — N6452 Nipple discharge: Secondary | ICD-10-CM | POA: Diagnosis not present

## 2014-12-13 DIAGNOSIS — M8589 Other specified disorders of bone density and structure, multiple sites: Secondary | ICD-10-CM | POA: Diagnosis not present

## 2015-02-03 DIAGNOSIS — H11153 Pinguecula, bilateral: Secondary | ICD-10-CM | POA: Diagnosis not present

## 2015-02-03 DIAGNOSIS — H2513 Age-related nuclear cataract, bilateral: Secondary | ICD-10-CM | POA: Diagnosis not present

## 2015-05-10 DIAGNOSIS — R5383 Other fatigue: Secondary | ICD-10-CM | POA: Diagnosis not present

## 2015-05-10 DIAGNOSIS — R52 Pain, unspecified: Secondary | ICD-10-CM | POA: Diagnosis not present

## 2015-05-10 DIAGNOSIS — T148 Other injury of unspecified body region: Secondary | ICD-10-CM | POA: Diagnosis not present

## 2015-05-10 DIAGNOSIS — M791 Myalgia: Secondary | ICD-10-CM | POA: Diagnosis not present

## 2015-05-31 ENCOUNTER — Encounter: Payer: Self-pay | Admitting: Nurse Practitioner

## 2015-05-31 ENCOUNTER — Ambulatory Visit (INDEPENDENT_AMBULATORY_CARE_PROVIDER_SITE_OTHER): Payer: Medicare Other | Admitting: Nurse Practitioner

## 2015-05-31 VITALS — BP 120/76 | HR 53 | Ht 63.0 in | Wt 118.8 lb

## 2015-05-31 DIAGNOSIS — I2510 Atherosclerotic heart disease of native coronary artery without angina pectoris: Secondary | ICD-10-CM

## 2015-05-31 DIAGNOSIS — E7800 Pure hypercholesterolemia, unspecified: Secondary | ICD-10-CM

## 2015-05-31 DIAGNOSIS — I10 Essential (primary) hypertension: Secondary | ICD-10-CM | POA: Diagnosis not present

## 2015-05-31 LAB — LIPID PANEL
Cholesterol: 178 mg/dL (ref 125–200)
HDL: 92 mg/dL (ref >=46)
LDL Cholesterol: 76 mg/dL (ref <130)
Total CHOL/HDL Ratio: 1.9 Ratio (ref <=5.0)
Triglycerides: 50 mg/dL (ref <150)
VLDL: 10 mg/dL (ref <30)

## 2015-05-31 LAB — BASIC METABOLIC PANEL
BUN: 23 mg/dL (ref 7–25)
CO2: 27 mmol/L (ref 20–31)
Calcium: 9.4 mg/dL (ref 8.6–10.4)
Chloride: 99 mmol/L (ref 98–110)
Creat: 0.8 mg/dL (ref 0.50–0.99)
Glucose, Bld: 92 mg/dL (ref 65–99)
Potassium: 4.9 mmol/L (ref 3.5–5.3)
Sodium: 135 mmol/L (ref 135–146)

## 2015-05-31 LAB — HEPATIC FUNCTION PANEL
ALT: 21 U/L (ref 6–29)
AST: 23 U/L (ref 10–35)
Albumin: 4.4 g/dL (ref 3.6–5.1)
Alkaline Phosphatase: 50 U/L (ref 33–130)
Bilirubin, Direct: 0.1 mg/dL (ref <=0.2)
Indirect Bilirubin: 0.6 mg/dL (ref 0.2–1.2)
Total Bilirubin: 0.7 mg/dL (ref 0.2–1.2)
Total Protein: 6.4 g/dL (ref 6.1–8.1)

## 2015-05-31 MED ORDER — NITROGLYCERIN 0.4 MG SL SUBL: 0.4000 mg | SUBLINGUAL_TABLET | SUBLINGUAL | Status: DC | PRN

## 2015-05-31 MED ORDER — SIMVASTATIN 40 MG PO TABS: 40.0000 mg | ORAL_TABLET | Freq: Every day | ORAL | Status: DC

## 2015-05-31 MED ORDER — OLMESARTAN MEDOXOMIL 20 MG PO TABS: 20.0000 mg | ORAL_TABLET | Freq: Every day | ORAL | Status: DC

## 2015-05-31 NOTE — Patient Instructions (Addendum)
We will be checking the following labs today - BMET, HPF and lipids   Medication Instructions:    Continue with your current medicines.   I have refilled the Benicar, Simvastatin and NTG today.     Testing/Procedures To Be Arranged:  N/A  Follow-Up:   See us back in one year.     Other Special Instructions:   N/A    If you need a refill on your cardiac medications before your next appointment, please call your pharmacy.   Call the [facility] office at [phone] if you have any questions, problems or concerns.

## 2015-05-31 NOTE — Progress Notes (Signed)
CARDIOLOGY OFFICE NOTE  Date:  05/31/2015    [name] Date of Birth: 27-Feb-1945 Medical Record #[id number]  PCP:  Joycelyn RuaMEYERS, STEPHEN, MD  Cardiologist:  Excell Seltzerooper    Chief Complaint  Patient presents with  . Coronary Artery Disease  . Hypertension  . Hyperlipidemia    1 year check - seen for Dr. Excell Seltzerooper    History of Present Illness: [name] is a 70 y.o. female who presents today for a one year check. Seen for Dr. Excell Seltzerooper.   She has known CAD, prior anterior MI in 2005 treated with DES to the LAD. Other issues include HLD, HTN and diverticulosis. Normal Myoview back in 2012.   Last seen here in May of 2016 - was doing well.  She had retired. Only taking aspirin sporadically.   Comes back today. Here alone. Doing well. No chest pain. Not short of breath. Exercising some "not as much as I should".   Past Medical History  Diagnosis Date  . Coronary artery disease 2005    a. s/p Ant STEMI 10/05 tx with Taxus DES to LAD;  b. cath 10/05: LM ok, LAD 99% tx with PCI, CFX ok, pRCA 50%, EF 30%;  c. echo 10/05: EF 55-60%, apical septal HK;  d. myoview 4/07: no scar or ischemia  . Hyperlipidemia   . Hypertension   . Diverticulosis     Past Surgical History  Procedure Laterality Date  . Cardiac catheterization  2005    TAXUS STENT LEFT ANTERIOR DESCENDING  . Thumb arthroscopy Right   . Colonoscopy  12/22/2001    Dr. Stan Headarl Gessner     Medications: Current Outpatient Prescriptions  Medication Sig Dispense Refill  . AMBULATORY NON FORMULARY MEDICATION Digestive Advantage 1 tablet by mouth 1-2 times per day    . aspirin EC 81 MG tablet Take 1 tablet (81 mg total) by mouth daily. 90 tablet 3  . B Complex-C (SUPER B COMPLEX PO) Take by mouth daily.      . cetirizine (ZYRTEC) 10 MG tablet Take 10 mg by mouth as needed.   2  . fluticasone (FLONASE) 50 MCG/ACT nasal spray Place 2 sprays into both nostrils as needed.   2  . naproxen sodium (ALEVE) 220 MG tablet Take  220 mg by mouth every morning.    . nitroGLYCERIN (NITROSTAT) 0.4 MG SL tablet Place 1 tablet (0.4 mg total) under the tongue every 5 (five) minutes as needed. 25 tablet 6  . olmesartan (BENICAR) 20 MG tablet Take 1 tablet (20 mg total) by mouth daily. 90 tablet 3  . simvastatin (ZOCOR) 40 MG tablet Take 1 tablet (40 mg total) by mouth daily at 6 PM. 90 tablet 3   No current facility-administered medications for this visit.    Allergies: No Known Allergies  Social History: The patient  reports that she quit smoking about 29 years ago. Her smoking use included Cigarettes. She smoked 0.50 packs per day. She has never used smokeless tobacco. She reports that she drinks about 1.2 oz of alcohol per week. She reports that she does not use illicit drugs.   Family History: The patient's family history includes Coronary artery disease in her sister; Heart disease in her mother; Heart disease (age of onset: 6640) in her brother; Pancreatic cancer in her father.   Review of Systems: Please see the history of present illness.   Otherwise, the review of systems is positive for none.   All other systems are reviewed  and negative.   Physical Exam: VS:  BP 120/76 mmHg  Pulse 53  Ht 5\' 3"  (1.6 m)  Wt 118 lb 12.8 oz (53.887 kg)  BMI 21.05 kg/m2 .  BMI Body mass index is 21.05 kg/(m^2).  Wt Readings from Last 3 Encounters:  05/31/15 118 lb 12.8 oz (53.887 kg)  06/01/14 126 lb 1.9 oz (57.208 kg)  05/19/13 132 lb 6.4 oz (60.056 kg)    General: Pleasant. Well developed, well nourished and in no acute distress. Her weight is down 8 pounds over the past year.  HEENT: Normal. Neck: Supple, no JVD, carotid bruits, or masses noted.  Cardiac: Regular rate and rhythm. No murmurs, rubs, or gallops. No edema.  Respiratory:  Lungs are clear to auscultation bilaterally with normal work of breathing.  GI: Soft and nontender.  MS: No deformity or atrophy. Gait and ROM intact. Skin: Warm and dry. Color is normal.    Neuro:  Strength and sensation are intact and no gross focal deficits noted.  Psych: Alert, appropriate and with normal affect.   LABORATORY DATA:  EKG:  EKG is ordered today. This demonstrates sinus bradycardia.  Lab Results  Component Value Date   WBC 5.5 05/30/2010   HGB 13.5 05/30/2010   HCT 39.5 05/30/2010   PLT 196.0 05/30/2010   GLUCOSE 102* 06/01/2014   CHOL 173 06/01/2014   TRIG 80.0 06/01/2014   HDL 68.80 06/01/2014   LDLCALC 88 06/01/2014   ALT 14 06/01/2014   AST 19 06/01/2014   NA 136 06/01/2014   K 4.9 06/01/2014   CL 101 06/01/2014   CREATININE 0.77 06/01/2014   BUN 13 06/01/2014   CO2 34* 06/01/2014   TSH 1.00 05/30/2010    BNP (last 3 results) No results for input(s): BNP in the last 8760 hours.  ProBNP (last 3 results) No results for input(s): PROBNP in the last 8760 hours.   Other Studies Reviewed Today:  Myoview Impression from 2012  Exercise Capacity: Excellent exercise capacity.  BP Response: Hypertensive blood pressure response.  Clinical Symptoms: No chest pain.  ECG Impression: No significant ST segment change suggestive of ischemia.  Comparison with Prior Nuclear Study: No significant change from previous study  Overall Impression: Normal stress nuclear study. There is no evidence of ischemia. The LV function is normal.  Elyn Aquas., MD, [facility]    Assessment / Plan: 1. CAD with past anterior MI with DES to the LAD in 2005 - negative Myoview in 2012 - doing very well clinically. Continue with her current regimen. NTG refilled today. See back in a year. Continue with CV risk factor modification.  2. HTN - BP great. No change in therapy. Benicar refilled - ok to change to generic. I have asked her to monitor BP at home - if BP lower with continued weight loss, may need reduction of her medicines.   3. HLD - on statin therapy - needs labs updated today.  Current medicines are reviewed with the patient today.  The patient  does not have concerns regarding medicines other than what has been noted above.  The following changes have been made:  See above.  Labs/ tests ordered today include:    Orders Placed This Encounter  Procedures  . Basic metabolic panel  . Hepatic function panel  . Lipid panel  . EKG 12-Lead     Disposition:   FU with Dr. Excell Seltzer in one year.    Patient is agreeable to this plan and will call if  any problems develop in the interim.   Signed: Rosalio Macadamia, RN, ANP-C 05/31/2015 9:45 AM  [facility] [address] [place], [place]  [address] Phone: [phone] Fax: [phone]

## 2015-09-20 ENCOUNTER — Telehealth: Payer: Self-pay | Admitting: Nurse Practitioner

## 2015-09-20 MED ORDER — LOSARTAN POTASSIUM 100 MG PO TABS: 100.0000 mg | ORAL_TABLET | Freq: Every day | ORAL | 3 refills | Status: DC

## 2015-09-20 NOTE — Telephone Encounter (Signed)
Not clear what the issue is. Please call.   This is not a new medicine.

## 2015-09-20 NOTE — Telephone Encounter (Signed)
New message   Pt C/O medication issue:  1. Name of Medication: benicar   2. How are you currently taking this medication (dosage and times per day)?  20 mg once a day    3. Are you having a reaction (difficulty breathing--STAT)? Patient think she is - patient on generic version - having more problem with pressure, aches,     4. What is your medication issue? Wants to discuss with nurse

## 2015-09-20 NOTE — Telephone Encounter (Signed)
S/w pt is agreeable to treatment plan. Pt has been taking olmesartan stated about two weeks and bp has been all over the place including up in the 160's.  Jaw tightening, joint aches, muscle pains. Stated was taking medication at in the pm switch the am about a week ago and still not any better.  Stated has to take a Asprin in the pm due to aches.  Lawson FiscalLori advised and switched pt to losartan (100 mg ) daily. Sent in today to pt's requested pharmacy and pt will start medication tomorrow.  Medication list updated.

## 2015-09-22 ENCOUNTER — Other Ambulatory Visit: Payer: Self-pay

## 2015-09-28 ENCOUNTER — Other Ambulatory Visit: Payer: Self-pay | Admitting: *Deleted

## 2015-09-28 MED ORDER — LOSARTAN POTASSIUM 100 MG PO TABS: 100.0000 mg | ORAL_TABLET | Freq: Every day | ORAL | 3 refills | Status: DC

## 2015-09-28 NOTE — Telephone Encounter (Signed)
losartan (COZAAR) 100 MG tablet  Medication  Date: 09/20/2015 [facility] [address] Office Ordering/Authorizing: Rosalio MacadamiaLori C Gerhardt, NP  Order Providers   Prescribing Provider Encounter Provider  Rosalio MacadamiaLori C Gerhardt, NP Rosalio MacadamiaLori C Gerhardt, NP  Medication Detail    Disp Refills Start End   losartan (COZAAR) 100 MG tablet 30 tablet 3 09/20/2015 12/19/2015   Sig - Route: Take 1 tablet (100 mg total) by mouth daily. - Oral   E-Prescribing Status: Receipt confirmed by pharmacy (09/20/2015 10:24 AM EDT)   Pharmacy   [facility] [phone] - [place], [place] - [address]   Sending to mail order as requested

## 2015-10-10 ENCOUNTER — Telehealth: Payer: Self-pay | Admitting: Nurse Practitioner

## 2015-10-10 NOTE — Telephone Encounter (Signed)
New message      Pt c/o medication issue:  1. Name of Medication: losartan/potassium 2. How are you currently taking this medication (dosage and times per day)?   3. Are you having a reaction (difficulty breathing--STAT)? no  4. What is your medication issue? Calling to see why last presc was filled with losartan only-----no potassium?

## 2015-10-10 NOTE — Telephone Encounter (Signed)
S/w pt explained did not no why it says potassium after losartan.  Pt stated the new script does not say potassium after the Losartan,  stated it's the same medication and same dose.  Pt stated appreciation. [place]

## 2015-10-25 DIAGNOSIS — Z23 Encounter for immunization: Secondary | ICD-10-CM | POA: Diagnosis not present

## 2015-10-25 DIAGNOSIS — H9313 Tinnitus, bilateral: Secondary | ICD-10-CM | POA: Diagnosis not present

## 2015-11-01 DIAGNOSIS — H9313 Tinnitus, bilateral: Secondary | ICD-10-CM | POA: Diagnosis not present

## 2015-11-01 DIAGNOSIS — H9311 Tinnitus, right ear: Secondary | ICD-10-CM | POA: Diagnosis not present

## 2015-11-01 DIAGNOSIS — H903 Sensorineural hearing loss, bilateral: Secondary | ICD-10-CM | POA: Diagnosis not present

## 2016-02-17 DIAGNOSIS — Z1231 Encounter for screening mammogram for malignant neoplasm of breast: Secondary | ICD-10-CM | POA: Diagnosis not present

## 2016-06-06 ENCOUNTER — Ambulatory Visit (INDEPENDENT_AMBULATORY_CARE_PROVIDER_SITE_OTHER): Payer: Medicare Other | Admitting: Nurse Practitioner

## 2016-06-06 ENCOUNTER — Encounter: Payer: Self-pay | Admitting: Nurse Practitioner

## 2016-06-06 VITALS — BP 158/88 | HR 50 | Ht 63.0 in | Wt 122.1 lb

## 2016-06-06 DIAGNOSIS — I10 Essential (primary) hypertension: Secondary | ICD-10-CM

## 2016-06-06 DIAGNOSIS — E7800 Pure hypercholesterolemia, unspecified: Secondary | ICD-10-CM

## 2016-06-06 DIAGNOSIS — I2510 Atherosclerotic heart disease of native coronary artery without angina pectoris: Secondary | ICD-10-CM | POA: Diagnosis not present

## 2016-06-06 LAB — LIPID PANEL
Chol/HDL Ratio: 1.9 ratio (ref 0.0–4.4)
Cholesterol, Total: 183 mg/dL (ref 100–199)
HDL: 96 mg/dL (ref >39)
LDL Calculated: 77 mg/dL (ref 0–99)
Triglycerides: 51 mg/dL (ref 0–149)
VLDL Cholesterol Cal: 10 mg/dL (ref 5–40)

## 2016-06-06 LAB — BASIC METABOLIC PANEL
BUN/Creatinine Ratio: 15 (ref 12–28)
BUN: 13 mg/dL (ref 8–27)
CO2: 26 mmol/L (ref 18–29)
Calcium: 9.7 mg/dL (ref 8.7–10.3)
Chloride: 100 mmol/L (ref 96–106)
Creatinine, Ser: 0.86 mg/dL (ref 0.57–1.00)
GFR calc Af Amer: 80 mL/min/{1.73_m2} (ref >59)
GFR calc non Af Amer: 69 mL/min/{1.73_m2} (ref >59)
Glucose: 96 mg/dL (ref 65–99)
Potassium: 4.8 mmol/L (ref 3.5–5.2)
Sodium: 139 mmol/L (ref 134–144)

## 2016-06-06 LAB — HEPATIC FUNCTION PANEL
ALT: 18 IU/L (ref 0–32)
AST: 24 IU/L (ref 0–40)
Albumin: 4.6 g/dL (ref 3.6–4.8)
Alkaline Phosphatase: 62 IU/L (ref 39–117)
Bilirubin Total: 0.8 mg/dL (ref 0.0–1.2)
Bilirubin, Direct: 0.21 mg/dL (ref 0.00–0.40)
Total Protein: 6.5 g/dL (ref 6.0–8.5)

## 2016-06-06 MED ORDER — LOSARTAN POTASSIUM 100 MG PO TABS: 100.0000 mg | ORAL_TABLET | Freq: Every day | ORAL | 3 refills | Status: DC

## 2016-06-06 MED ORDER — HYDROCHLOROTHIAZIDE 25 MG PO TABS: 25.0000 mg | ORAL_TABLET | Freq: Every day | ORAL | 3 refills | Status: DC

## 2016-06-06 MED ORDER — SIMVASTATIN 40 MG PO TABS: 40.0000 mg | ORAL_TABLET | Freq: Every day | ORAL | 3 refills | Status: DC

## 2016-06-06 NOTE — Patient Instructions (Addendum)
We will be checking the following labs today - BMET, Lipids and HPF   Medication Instructions:    Continue with your current medicines. BUT  I am adding HCTZ 25 mg to take daily - this is at your local drug store.   I sent in your other refills today    Testing/Procedures To Be Arranged:  N/A  Follow-Up:   See me in 3 weeks - will plan to recheck BMET then -do not need to fast   Other Special Instructions:   Monitor your BP for me - send me a message if it is not coming down.     If you need a refill on your cardiac medications before your next appointment, please call your pharmacy.   Call the [facility] office at [phone] if you have any questions, problems or concerns.

## 2016-06-06 NOTE — Progress Notes (Signed)
CARDIOLOGY OFFICE NOTE  Date:  06/06/2016    [name] Date of Birth: Nov 16, 1945 Medical Record #[id number]  PCP:  Joycelyn Rua, MD  Cardiologist:  Kirt Boys    Chief Complaint  Patient presents with  . Coronary Artery Disease    Follow up visit - seen for Dr. Excell Seltzer    History of Present Illness: [name] is a 71 y.o. female who presents today for a one year check. Seen for Dr. Excell Seltzer.   She has known CAD, prior anterior MI in 2005 treated with DES to the LAD. Other issues include HLD, HTN and diverticulosis. Normal Myoview back in 2012.   Last seen here in May of 2017 - was doing well.    Comes in today. Here alone. Has been doing ok but BP creeping back up. Took extra 1/2 of Losartan a few days ago when it was 190/90. Still up some here today. Some "indigestion" which is hard for her to describe. She has not had anything like her prior chest pain syndrome. She noted this indigestion was when BP was up as well. Lots of stress - she broke down and told me her husband has died and her horse died - she is just devastated. Still getting out and playing golf.   Past Medical History:  Diagnosis Date  . Coronary artery disease 2005   a. s/p Ant STEMI 10/05 tx with Taxus DES to LAD;  b. cath 10/05: LM ok, LAD 99% tx with PCI, CFX ok, pRCA 50%, EF 30%;  c. echo 10/05: EF 55-60%, apical septal HK;  d. myoview 4/07: no scar or ischemia  . Diverticulosis   . Hyperlipidemia   . Hypertension     Past Surgical History:  Procedure Laterality Date  . CARDIAC CATHETERIZATION  2005   TAXUS STENT LEFT ANTERIOR DESCENDING  . COLONOSCOPY  12/22/2001   Dr. Stan Head  . THUMB ARTHROSCOPY Right      Medications: Current Outpatient Prescriptions  Medication Sig Dispense Refill  . AMBULATORY NON FORMULARY MEDICATION Digestive Advantage 1 tablet by mouth 1-2 times per day    . aspirin EC 81 MG tablet Take 1 tablet (81 mg total) by mouth daily. 90  tablet 3  . B Complex-C (SUPER B COMPLEX PO) Take by mouth daily.      . fluticasone (FLONASE) 50 MCG/ACT nasal spray Place 2 sprays into both nostrils as needed.   2  . naproxen sodium (ALEVE) 220 MG tablet Take 220 mg by mouth every morning.    . nitroGLYCERIN (NITROSTAT) 0.4 MG SL tablet Place 1 tablet (0.4 mg total) under the tongue every 5 (five) minutes as needed. 25 tablet 6  . simvastatin (ZOCOR) 40 MG tablet Take 1 tablet (40 mg total) by mouth daily at 6 PM. 90 tablet 3  . hydrochlorothiazide (HYDRODIURIL) 25 MG tablet Take 1 tablet (25 mg total) by mouth daily. 90 tablet 3  . losartan (COZAAR) 100 MG tablet Take 1 tablet (100 mg total) by mouth daily. 90 tablet 3   No current facility-administered medications for this visit.     Allergies: No Known Allergies  Social History: The patient  reports that she quit smoking about 30 years ago. Her smoking use included Cigarettes. She smoked 0.50 packs per day. She has never used smokeless tobacco. She reports that she drinks about 1.2 oz of alcohol per week . She reports that she does not use drugs.   Family History: The  patient's family history includes Coronary artery disease in her sister; Heart disease in her mother; Heart disease (age of onset: 82) in her brother; Pancreatic cancer in her father.   Review of Systems: Please see the history of present illness.   Otherwise, the review of systems is positive for none.   All other systems are reviewed and negative.   Physical Exam: VS:  BP (!) 158/88 (BP Location: Left Arm, Patient Position: Sitting, Cuff Size: Normal)   Pulse (!) 50   Ht 5\' 3"  (1.6 m)   Wt 122 lb 1.9 oz (55.4 kg)   BMI 21.63 kg/m  .  BMI Body mass index is 21.63 kg/m.  Wt Readings from Last 3 Encounters:  06/06/16 122 lb 1.9 oz (55.4 kg)  05/31/15 118 lb 12.8 oz (53.9 kg)  06/01/14 126 lb 1.9 oz (57.2 kg)   BP recheck by me is 180/90  General: Pleasant. She is tearful but alert and in no acute distress.    HEENT: Normal.  Neck: Supple, no JVD, carotid bruits, or masses noted.  Cardiac: Regular rate and rhythm. No murmurs, rubs, or gallops. No edema.  Respiratory:  Lungs are clear to auscultation bilaterally with normal work of breathing.  GI: Soft and nontender.  MS: No deformity or atrophy. Gait and ROM intact.  Skin: Warm and dry. Color is normal.  Neuro:  Strength and sensation are intact and no gross focal deficits noted.  Psych: Alert, appropriate and with normal affect.   LABORATORY DATA:  EKG:  EKG is ordered today. This demonstrates sinus bradycardia.  Lab Results  Component Value Date   WBC 5.5 05/30/2010   HGB 13.5 05/30/2010   HCT 39.5 05/30/2010   PLT 196.0 05/30/2010   GLUCOSE 92 05/31/2015   CHOL 178 05/31/2015   TRIG 50 05/31/2015   HDL 92 05/31/2015   LDLCALC 76 05/31/2015   ALT 21 05/31/2015   AST 23 05/31/2015   NA 135 05/31/2015   K 4.9 05/31/2015   CL 99 05/31/2015   CREATININE 0.80 05/31/2015   BUN 23 05/31/2015   CO2 27 05/31/2015   TSH 1.00 05/30/2010    BNP (last 3 results) No results for input(s): BNP in the last 8760 hours.  ProBNP (last 3 results) No results for input(s): PROBNP in the last 8760 hours.   Other Studies Reviewed Today:  Myoview Impression from 2012  Exercise Capacity: Excellent exercise capacity.  BP Response: Hypertensive blood pressure response.  Clinical Symptoms: No chest pain.  ECG Impression: No significant ST segment change suggestive of ischemia.  Comparison with Prior Nuclear Study: No significant change from previous study  Overall Impression: Normal stress nuclear study. There is no evidence of ischemia. The LV function is normal.  Elyn Aquas., MD, [facility]    Assessment / Plan:  1. HTN - not controlled - I suspect her stress is really playing a role - will add HCTZ 25 mg a day - she is to continue to monitor her BP - if this works we can change to Hyzaar on return. Will recheck BMET on return.    2. Situational stress - very sad.  3. CAD with past anterior MI with DES to the LAD in 2005 - negative Myoview in 2012 - needs BP treated - consider stress testing if this "indigestion" persists in the setting of her BP improving.   4. HLD - on statin - refilled today - needs labs today.    Current medicines are reviewed with  the patient today.  The patient does not have concerns regarding medicines other than what has been noted above.  The following changes have been made:  See above.  Labs/ tests ordered today include:    Orders Placed This Encounter  Procedures  . Basic metabolic panel  . Hepatic function panel  . Lipid panel  . EKG 12-Lead     Disposition:   FU with me in 3 weeks with BMET on return.    Patient is agreeable to this plan and will call if any problems develop in the interim.   SignedNorma Fredrickson: Adileny Delon, NP  06/06/2016 9:27 AM  [facility] [address] [place], [place]  [address] Phone: [phone] Fax: [phone]) [phone]

## 2016-06-11 ENCOUNTER — Encounter: Payer: Self-pay | Admitting: Nurse Practitioner

## 2016-06-18 ENCOUNTER — Encounter: Payer: Self-pay | Admitting: Nurse Practitioner

## 2016-06-18 ENCOUNTER — Telehealth (HOSPITAL_COMMUNITY): Payer: Self-pay | Admitting: *Deleted

## 2016-06-18 ENCOUNTER — Telehealth: Payer: Self-pay | Admitting: *Deleted

## 2016-06-18 ENCOUNTER — Other Ambulatory Visit: Payer: Self-pay | Admitting: *Deleted

## 2016-06-18 DIAGNOSIS — I2510 Atherosclerotic heart disease of native coronary artery without angina pectoris: Secondary | ICD-10-CM

## 2016-06-18 NOTE — Telephone Encounter (Signed)
Patient given detailed instructions per Myocardial Perfusion Study Information Sheet for the test on 06/20/16 at 0730. Patient notified to arrive 15 minutes early and that it is imperative to arrive on time for appointment to keep from having the test rescheduled.  If you need to cancel or reschedule your appointment, please call the office within 24 hours of your appointment. . Patient verbalized understanding.Shardee Dieu, Adelene IdlerCynthia W

## 2016-06-18 NOTE — Telephone Encounter (Signed)
S/w pt is coming in this week for a exercise stress per Lawson FiscalLori. Pt stated no pain when pt works out and [employer].  BP elevated, the little tick stopped in chest when pt started taking digestive advantage, right arm hurts only when bp is elevated. Pt was appreciative with scheduling test. Pt has f/u next week with Lawson FiscalLori.

## 2016-06-20 ENCOUNTER — Ambulatory Visit (HOSPITAL_COMMUNITY): Payer: Medicare Other | Attending: Cardiology

## 2016-06-20 DIAGNOSIS — I2510 Atherosclerotic heart disease of native coronary artery without angina pectoris: Secondary | ICD-10-CM | POA: Diagnosis not present

## 2016-06-20 LAB — MYOCARDIAL PERFUSION IMAGING
Estimated workload: 12.5 METS
Exercise duration (min): 10 min
Exercise duration (sec): 30 s
LV dias vol: 84 mL (ref 46–106)
LV sys vol: 32 mL
MPHR: 151 {beats}/min
Peak HR: 153 {beats}/min
Percent HR: 101 %
RATE: 0.27
Rest HR: 57 {beats}/min
SDS: 1
SRS: 1
SSS: 2
TID: 0.89

## 2016-06-20 MED ORDER — TECHNETIUM TC 99M TETROFOSMIN IV KIT
10.8000 | PACK | Freq: Once | INTRAVENOUS | Status: AC | PRN
  Administered 2016-06-20: 10.8 via INTRAVENOUS
  Filled 2016-06-20: qty 11

## 2016-06-20 MED ORDER — TECHNETIUM TC 99M TETROFOSMIN IV KIT
32.0000 | PACK | Freq: Once | INTRAVENOUS | Status: AC | PRN
  Administered 2016-06-20: 32 via INTRAVENOUS
  Filled 2016-06-20: qty 32

## 2016-06-25 ENCOUNTER — Encounter: Payer: Self-pay | Admitting: Nurse Practitioner

## 2016-06-25 ENCOUNTER — Ambulatory Visit (INDEPENDENT_AMBULATORY_CARE_PROVIDER_SITE_OTHER): Payer: Medicare Other | Admitting: Nurse Practitioner

## 2016-06-25 VITALS — BP 178/86 | HR 57 | Ht 63.0 in | Wt 124.1 lb

## 2016-06-25 DIAGNOSIS — E7800 Pure hypercholesterolemia, unspecified: Secondary | ICD-10-CM

## 2016-06-25 DIAGNOSIS — I2510 Atherosclerotic heart disease of native coronary artery without angina pectoris: Secondary | ICD-10-CM | POA: Diagnosis not present

## 2016-06-25 DIAGNOSIS — I10 Essential (primary) hypertension: Secondary | ICD-10-CM

## 2016-06-25 LAB — BASIC METABOLIC PANEL
BUN/Creatinine Ratio: 16 (ref 12–28)
BUN: 12 mg/dL (ref 8–27)
CO2: 26 mmol/L (ref 20–29)
Calcium: 9.1 mg/dL (ref 8.7–10.3)
Chloride: 95 mmol/L — ABNORMAL LOW (ref 96–106)
Creatinine, Ser: 0.73 mg/dL (ref 0.57–1.00)
GFR calc Af Amer: 97 mL/min/{1.73_m2} (ref >59)
GFR calc non Af Amer: 84 mL/min/{1.73_m2} (ref >59)
Glucose: 94 mg/dL (ref 65–99)
Potassium: 4.1 mmol/L (ref 3.5–5.2)
Sodium: 134 mmol/L (ref 134–144)

## 2016-06-25 MED ORDER — HYDROCHLOROTHIAZIDE 25 MG PO TABS: 25.0000 mg | ORAL_TABLET | Freq: Every day | ORAL | 3 refills | Status: DC

## 2016-06-25 NOTE — Progress Notes (Signed)
CARDIOLOGY OFFICE NOTE  Date:  06/25/2016    [name] Date of Birth: 1945-07-13 Medical Record #[id number]  PCP:  Erin Rua, MD  Cardiologist:  Kirt Boys    Chief Complaint  Patient presents with  . Hypertension    Follow up visit - seen for Dr. Excell Seltzer    History of Present Illness: [name] is a 71 y.o. female who presents today for a follow up visit. Seen for Dr. Excell Seltzer.   She has known CAD, prior anterior MI in 2005 treated with DES to the LAD. Other issues include HLD, HTN and diverticulosis. Normal Myoview back in 2012.   Seen here in May of 2017 - was doing well.   I saw her last month for her annual check - BP creeping up. Lots of stress - her husband had died. Her horse had died. I added HCTZ to her regimen. She has since sent me some MyChart messages - BP doing better. Was having some atypical chest pain and we got her Myoview updated in the interim as well. This turned out ok.   Comes in today. Here alone. She is doing better. Still pretty teary eyed today but seems better. BP around 130 to 140 at home. Feels ok on her medicines. [place] Getting [place] house ready to put on the market. No more chest pain.   Past Medical History:  Diagnosis Date  . Coronary artery disease 2005   a. s/p Ant STEMI 10/05 tx with Taxus DES to LAD;  b. cath 10/05: LM ok, LAD 99% tx with PCI, CFX ok, pRCA 50%, EF 30%;  c. echo 10/05: EF 55-60%, apical septal HK;  d. myoview 4/07: no scar or ischemia  . Diverticulosis   . Hyperlipidemia   . Hypertension     Past Surgical History:  Procedure Laterality Date  . CARDIAC CATHETERIZATION  2005   TAXUS STENT LEFT ANTERIOR DESCENDING  . COLONOSCOPY  12/22/2001   Dr. Stan Head  . THUMB ARTHROSCOPY Right      Medications: Current Outpatient Prescriptions  Medication Sig Dispense Refill  . AMBULATORY NON FORMULARY MEDICATION Digestive Advantage 1 tablet by mouth 1-2  times per day    . aspirin EC 81 MG tablet Take 1 tablet (81 mg total) by mouth daily. 90 tablet 3  . B Complex-C (SUPER B COMPLEX PO) Take by mouth daily.      . fluticasone (FLONASE) 50 MCG/ACT nasal spray Place 2 sprays into both nostrils as needed.   2  . hydrochlorothiazide (HYDRODIURIL) 25 MG tablet Take 1 tablet (25 mg total) by mouth daily. 90 tablet 3  . losartan (COZAAR) 100 MG tablet Take 1 tablet (100 mg total) by mouth daily. 90 tablet 3  . naproxen sodium (ALEVE) 220 MG tablet Take 220 mg by mouth every morning.    . nitroGLYCERIN (NITROSTAT) 0.4 MG SL tablet Place 1 tablet (0.4 mg total) under the tongue every 5 (five) minutes as needed. 25 tablet 6  . simvastatin (ZOCOR) 40 MG tablet Take 1 tablet (40 mg total) by mouth daily at 6 PM. 90 tablet 3   No current facility-administered medications for this visit.     Allergies: No Known Allergies  Social History: The patient  reports that she quit smoking about 30 years ago. Her smoking use included Cigarettes. She smoked 0.50 packs per day. She has never used smokeless tobacco. She reports that she drinks about 1.2 oz  of alcohol per week . She reports that she does not use drugs.   Family History: The patient's family history includes Coronary artery disease in her sister; Heart disease in her mother; Heart disease (age of onset: 3) in her brother; Pancreatic cancer in her father.   Review of Systems: Please see the history of present illness.   Otherwise, the review of systems is positive for none.   All other systems are reviewed and negative.   Physical Exam: VS:  BP (!) 178/86 (BP Location: Left Arm, Patient Position: Sitting, Cuff Size: Normal)   Pulse (!) 57   Ht 5\' 3"  (1.6 m)   Wt 124 lb 1.9 oz (56.3 kg)   SpO2 100% Comment: at rest  BMI 21.99 kg/m  .  BMI Body mass index is 21.99 kg/m.  Wt Readings from Last 3 Encounters:  06/25/16 124 lb 1.9 oz (56.3 kg)  06/20/16 122 lb (55.3 kg)  06/06/16 122 lb 1.9 oz  (55.4 kg)    General: Pleasant. Well developed, well nourished and in no acute distress.  Little tearful but overall seems better today.  HEENT: Normal.  Neck: Supple, no JVD, carotid bruits, or masses noted.  Cardiac: Regular rate and rhythm. No murmurs, rubs, or gallops. No edema.  Respiratory:  Lungs are clear to auscultation bilaterally with normal work of breathing.  GI: Soft and nontender.  MS: No deformity or atrophy. Gait and ROM intact.  Skin: Warm and dry. Color is normal.  Neuro:  Strength and sensation are intact and no gross focal deficits noted.  Psych: Alert, appropriate and with normal affect.   LABORATORY DATA:  EKG:  EKG is not ordered today.  Lab Results  Component Value Date   WBC 5.5 05/30/2010   HGB 13.5 05/30/2010   HCT 39.5 05/30/2010   PLT 196.0 05/30/2010   GLUCOSE 96 06/06/2016   CHOL 183 06/06/2016   TRIG 51 06/06/2016   HDL 96 06/06/2016   LDLCALC 77 06/06/2016   ALT 18 06/06/2016   AST 24 06/06/2016   NA 139 06/06/2016   K 4.8 06/06/2016   CL 100 06/06/2016   CREATININE 0.86 06/06/2016   BUN 13 06/06/2016   CO2 26 06/06/2016   TSH 1.00 05/30/2010   No components found for: HEMOGLOBIN   BNP (last 3 results) No results for input(s): BNP in the last 8760 hours.  ProBNP (last 3 results) No results for input(s): PROBNP in the last 8760 hours.   Other Studies Reviewed Today:  Myoview Study Highlights 06/2016     Nuclear stress EF: 62%.  Blood pressure demonstrated a normal response to exercise.  Downsloping ST segment depression ST segment depression of 0.5 mm was noted during stress in the V4, V5, V6, II, III and aVF leads.  This is a low risk study.  The left ventricular ejection fraction is normal (55-65%).   1. Normal LV systolic function and wall motion.  2. Excellent exercise tolerance.  3. < 1 mm downsloping ST depression noted in recovery in inferior leads + V4-V6.  This is nonspecific.  4. No evidence for ischemia or  infarction on perfusion images.   Low risk study.     Assessment / Plan:  1. HTN - she has better control at home - I have left her on her current regimen. Will change to Hyzaar if she desires when refills are needed. She will continue to monitor at home. BMET today.  2. Situational stress - very sad but she  seems to be doing ok. [place] in the [place].   3. CAD with past anterior MI with DES to the LAD in 2005 - recent negative Myoview - she has put herself back on her probiotics and her indigestion is resolved. Will continue with her current regimen.   4. HLD - on statin therapy.   Current medicines are reviewed with the patient today.  The patient does not have concerns regarding medicines other than what has been noted above.  The following changes have been made:  See above.  Labs/ tests ordered today include:    Orders Placed This Encounter  Procedures  . Basic metabolic panel     Disposition:   FU with me in one year.    Patient is agreeable to this plan and will call if any problems develop in the interim.   SignedNorma Fredrickson: Leauna Sharber, NP  06/25/2016 12:15 PM  [facility] [address] [place], [place]  [address] Phone: [phone] Fax: [phone]

## 2016-06-25 NOTE — Patient Instructions (Addendum)
We will be checking the following labs today - BMET   Medication Instructions:    Continue with your current medicines.     Testing/Procedures To Be Arranged:  N/A  Follow-Up:   See me in one year     Other Special Instructions:   Keep a check on your BP for me - goal is around 135/85 or less.     If you need a refill on your cardiac medications before your next appointment, please call your pharmacy.   Call the [facility] office at [phone] if you have any questions, problems or concerns.

## 2016-06-26 ENCOUNTER — Encounter: Payer: Self-pay | Admitting: Nurse Practitioner

## 2016-06-27 ENCOUNTER — Other Ambulatory Visit: Payer: Self-pay | Admitting: Nurse Practitioner

## 2016-06-27 MED ORDER — AMLODIPINE BESYLATE 2.5 MG PO TABS: 2.5000 mg | ORAL_TABLET | Freq: Every day | ORAL | 11 refills | Status: DC

## 2016-06-28 ENCOUNTER — Encounter: Payer: Self-pay | Admitting: Nurse Practitioner

## 2016-07-09 ENCOUNTER — Encounter: Payer: Self-pay | Admitting: Nurse Practitioner

## 2016-07-10 ENCOUNTER — Other Ambulatory Visit: Payer: Self-pay | Admitting: *Deleted

## 2016-07-10 MED ORDER — AMLODIPINE BESYLATE 2.5 MG PO TABS: 2.5000 mg | ORAL_TABLET | Freq: Every day | ORAL | 3 refills | Status: DC

## 2016-07-27 ENCOUNTER — Other Ambulatory Visit: Payer: Self-pay | Admitting: Nurse Practitioner

## 2016-09-08 ENCOUNTER — Inpatient Hospital Stay (HOSPITAL_COMMUNITY)
Admission: EM | Admit: 2016-09-08 | Discharge: 2016-09-09 | DRG: 948 | Disposition: A | Discharge status: Home / Self Care | Payer: Medicare Other | Attending: Internal Medicine | Admitting: Internal Medicine

## 2016-09-08 ENCOUNTER — Encounter (HOSPITAL_COMMUNITY): Payer: Self-pay | Admitting: *Deleted

## 2016-09-08 ENCOUNTER — Emergency Department (HOSPITAL_COMMUNITY): Payer: Medicare Other

## 2016-09-08 DIAGNOSIS — W5512XA Struck by horse, initial encounter: Secondary | ICD-10-CM | POA: Diagnosis not present

## 2016-09-08 DIAGNOSIS — R55 Syncope and collapse: Secondary | ICD-10-CM | POA: Diagnosis not present

## 2016-09-08 DIAGNOSIS — Z87891 Personal history of nicotine dependence: Secondary | ICD-10-CM

## 2016-09-08 DIAGNOSIS — Z8249 Family history of ischemic heart disease and other diseases of the circulatory system: Secondary | ICD-10-CM

## 2016-09-08 DIAGNOSIS — E785 Hyperlipidemia, unspecified: Secondary | ICD-10-CM | POA: Diagnosis not present

## 2016-09-08 DIAGNOSIS — Z7982 Long term (current) use of aspirin: Secondary | ICD-10-CM

## 2016-09-08 DIAGNOSIS — R413 Other amnesia: Secondary | ICD-10-CM | POA: Diagnosis not present

## 2016-09-08 DIAGNOSIS — S0990XA Unspecified injury of head, initial encounter: Secondary | ICD-10-CM | POA: Diagnosis not present

## 2016-09-08 DIAGNOSIS — I10 Essential (primary) hypertension: Secondary | ICD-10-CM | POA: Diagnosis not present

## 2016-09-08 DIAGNOSIS — Z955 Presence of coronary angioplasty implant and graft: Secondary | ICD-10-CM | POA: Diagnosis not present

## 2016-09-08 DIAGNOSIS — S0083XA Contusion of other part of head, initial encounter: Secondary | ICD-10-CM | POA: Diagnosis not present

## 2016-09-08 DIAGNOSIS — R079 Chest pain, unspecified: Secondary | ICD-10-CM | POA: Diagnosis not present

## 2016-09-08 DIAGNOSIS — I2510 Atherosclerotic heart disease of native coronary artery without angina pectoris: Secondary | ICD-10-CM | POA: Diagnosis not present

## 2016-09-08 DIAGNOSIS — R040 Epistaxis: Secondary | ICD-10-CM | POA: Diagnosis not present

## 2016-09-08 DIAGNOSIS — M542 Cervicalgia: Secondary | ICD-10-CM | POA: Diagnosis not present

## 2016-09-08 DIAGNOSIS — I252 Old myocardial infarction: Secondary | ICD-10-CM | POA: Diagnosis not present

## 2016-09-08 DIAGNOSIS — R51 Headache: Secondary | ICD-10-CM | POA: Diagnosis not present

## 2016-09-08 LAB — COMPREHENSIVE METABOLIC PANEL
ALBUMIN: 4.6 g/dL (ref 3.5–5.0)
ALK PHOS: 58 U/L (ref 38–126)
ALT: 20 U/L (ref 14–54)
AST: 22 U/L (ref 15–41)
Anion gap: 10 (ref 5–15)
BILIRUBIN TOTAL: 0.6 mg/dL (ref 0.3–1.2)
BUN: 13 mg/dL (ref 6–20)
CALCIUM: 8.5 mg/dL — AB (ref 8.9–10.3)
CO2: 23 mmol/L (ref 22–32)
Chloride: 100 mmol/L — ABNORMAL LOW (ref 101–111)
Creatinine, Ser: 0.74 mg/dL (ref 0.44–1.00)
GFR calc Af Amer: >60 mL/min (ref >60)
GFR calc non Af Amer: >60 mL/min (ref >60)
GLUCOSE: 128 mg/dL — AB (ref 65–99)
Potassium: 3.6 mmol/L (ref 3.5–5.1)
Sodium: 133 mmol/L — ABNORMAL LOW (ref 135–145)
TOTAL PROTEIN: 6.9 g/dL (ref 6.5–8.1)

## 2016-09-08 LAB — PROTIME-INR
INR: 0.96
Prothrombin Time: 12.8 seconds (ref 11.4–15.2)

## 2016-09-08 LAB — CBC WITH DIFFERENTIAL/PLATELET
BASOS ABS: 0 10*3/uL (ref 0.0–0.1)
BASOS PCT: 0 %
Eosinophils Absolute: 0 10*3/uL (ref 0.0–0.7)
Eosinophils Relative: 0 %
HEMATOCRIT: 39.3 % (ref 36.0–46.0)
HEMOGLOBIN: 13.3 g/dL (ref 12.0–15.0)
Lymphocytes Relative: 14 %
Lymphs Abs: 1 10*3/uL (ref 0.7–4.0)
MCH: 31.7 pg (ref 26.0–34.0)
MCHC: 33.8 g/dL (ref 30.0–36.0)
MCV: 93.8 fL (ref 78.0–100.0)
MONOS PCT: 5 %
Monocytes Absolute: 0.3 10*3/uL (ref 0.1–1.0)
NEUTROS ABS: 5.6 10*3/uL (ref 1.7–7.7)
NEUTROS PCT: 81 %
Platelets: 206 10*3/uL (ref 150–400)
RBC: 4.19 MIL/uL (ref 3.87–5.11)
RDW: 13.4 % (ref 11.5–15.5)
WBC: 6.9 10*3/uL (ref 4.0–10.5)

## 2016-09-08 LAB — ETHANOL: Alcohol, Ethyl (B): <5 mg/dL (ref <5)

## 2016-09-08 LAB — APTT: aPTT: 29 seconds (ref 24–36)

## 2016-09-08 LAB — TROPONIN I: Troponin I: <0.03 ng/mL (ref <0.03)

## 2016-09-08 NOTE — ED Triage Notes (Signed)
Family reports that the pt was seen working with a horse at around 3 pm. Pt called a friend at around 4:15 and told them she was bleeding and injured and confused. Pt is unaware of what happed but has bruising and swelling to her nose and mouth. Pt states she has a "hole in the inside of her lip". Pt is now c/o a pain in the lower part of her head as well as a "small" headache."Pt is gradually remembering what happened earlier in the day but she still does not remember the incident.

## 2016-09-08 NOTE — ED Provider Notes (Signed)
[facility]-EMERGENCY DEPT Provider Note   CSN: [id number] Arrival date & time: 09/08/16  2012     History   Chief Complaint Chief Complaint  Patient presents with  . Facial Injury    HPI [name] is a 71 y.o. female.  HPI Patient was last seen normal around 3 PM by family. She was working with horses by herself. At 4:15 PM she called the pharmacy was working on. Was noted to be confused and sustained head injury. Initially was completely amnestic to the events of the entire day but now is only amnestic to the events concerning for trauma. States that she was playing golf earlier today. She then went for a ride before she saw her family. She complains of nasal swelling and pain and pain to her lower lip. Denies chest pain or shortness of breath. No nausea or vomiting. No posterior neck pain. No focal weakness or numbness. Denies drug or alcohol use. No recent changes to her medications. Past Medical History:  Diagnosis Date  . Coronary artery disease 2005   a. s/p Ant STEMI 10/05 tx with Taxus DES to LAD;  b. cath 10/05: LM ok, LAD 99% tx with PCI, CFX ok, pRCA 50%, EF 30%;  c. echo 10/05: EF 55-60%, apical septal HK;  d. myoview 4/07: no scar or ischemia  . Diverticulosis   . Hyperlipidemia   . Hypertension     Patient Active Problem List   Diagnosis Date Noted  . Amnesia 09/09/2016  . Amnesia memory loss 09/09/2016  . HYPERLIPIDEMIA 06/21/2008  . Essential hypertension 06/21/2008  . Coronary atherosclerosis of native coronary artery 06/21/2008    Past Surgical History:  Procedure Laterality Date  . CARDIAC CATHETERIZATION  2005   TAXUS STENT LEFT ANTERIOR DESCENDING  . COLONOSCOPY  12/22/2001   Dr. Stan Head  . THUMB ARTHROSCOPY Right     OB History    No data available       Home Medications    Prior to Admission medications   Medication Sig Start Date End Date Taking? Authorizing Provider  amLODipine (NORVASC) 2.5 MG tablet Take 1 tablet (2.5 mg  total) by mouth daily. 07/10/16 07/10/17 Yes Rosalio Macadamia, NP  aspirin EC 81 MG tablet Take 1 tablet (81 mg total) by mouth daily. 06/01/14  Yes Rosalio Macadamia, NP  B Complex-C (SUPER B COMPLEX PO) Take 1 tablet by mouth daily.    Yes [provider]  nitroGLYCERIN (NITROSTAT) 0.4 MG SL tablet Place 1 tablet (0.4 mg total) under the tongue every 5 (five) minutes as needed. 05/31/15  Yes Rosalio Macadamia, NP  simvastatin (ZOCOR) 40 MG tablet Take 1 tablet (40 mg total) by mouth daily at 6 PM. 06/06/16  Yes Rosalio Macadamia, NP    Family History Family History  Problem Relation Age of Onset  . Heart disease Mother   . Pancreatic cancer Father   . Heart disease Brother 40       HAD 3 MI's THAT STARTED IN HIS 40's HE IS NOW 59  . Coronary artery disease Sister     Social History Social History  Substance Use Topics  . Smoking status: Former Smoker    Packs/day: 0.50    Types: Cigarettes    Quit date: 01/15/1986  . Smokeless tobacco: Never Used  . Alcohol use 1.2 oz/week    1 Glasses of wine, 1 Cans of beer per week     Comment: BEER OR A GLASS OF WINE  MOST NIGHTS     Allergies   Patient has no known allergies.   Review of Systems Review of Systems  Constitutional: Negative for chills and fever.  HENT: Positive for facial swelling. Negative for dental problem, trouble swallowing and voice change.   Eyes: Negative for visual disturbance.  Respiratory: Negative for cough and shortness of breath.   Cardiovascular: Negative for chest pain, palpitations and leg swelling.  Gastrointestinal: Negative for abdominal pain, diarrhea, nausea and vomiting.  Genitourinary: Negative for dysuria, flank pain and frequency.  Musculoskeletal: Negative for neck pain.  Skin: Positive for wound.  Neurological: Positive for syncope. Negative for numbness and headaches.  Psychiatric/Behavioral: Positive for confusion.  All other systems reviewed and are negative.    Physical  Exam Updated Vital Signs BP (!) 148/63 (BP Location: Left Arm)   Pulse 61   Temp 98.3 F (36.8 C) (Oral)   Resp 18   Ht 5\' 3"  (1.6 m)   Wt 54.8 kg (120 lb 14.4 oz)   SpO2 100%   BMI 21.42 kg/m   Physical Exam  Constitutional: She appears well-developed and well-nourished. No distress.  HENT:  Head: Normocephalic.  Mouth/Throat: Oropharynx is clear and moist. No oropharyngeal exudate.  Patient has swelling to the base of the nose and mild tenderness especially on the right. Midface is stable. No malocclusion. Patient has a contusion to the mucosal surface of the lower lip. Just has evidence of contusion to the chin. No hemotympanum. No evidence of scalp trauma.  Eyes: Pupils are equal, round, and reactive to light. EOM are normal.  Neck: Normal range of motion. Neck supple.  No posterior midline cervical tenderness to palpation.  Cardiovascular: Normal rate and regular rhythm.  Exam reveals no gallop and no friction rub.   No murmur heard. Pulmonary/Chest: Effort normal and breath sounds normal. No respiratory distress. She has no wheezes. She has no rales. She exhibits no tenderness.  Abdominal: Soft. Bowel sounds are normal. There is no tenderness. There is no rebound and no guarding.  Musculoskeletal: Normal range of motion. She exhibits no edema or tenderness.  No midline thoracic or lumbar tenderness. No obvious trauma to the extremities. Distal pulses are 2+.  Neurological: She is alert.  Patient is oriented to person and place. Does not remember year. Does not remember president. 5/5 motor in all extremities. Sensation is fully intact. Cranial nerves II through XII grossly intact.  Skin: Skin is warm and dry. Capillary refill takes less than 2 seconds. No rash noted. No erythema.  Psychiatric: She has a normal mood and affect. Her behavior is normal.  Nursing note and vitals reviewed.    ED Treatments / Results  Labs (all labs ordered are listed, but only abnormal results  are displayed) Labs Reviewed  COMPREHENSIVE METABOLIC PANEL - Abnormal; Notable for the following:       Result Value   Sodium 133 (*)    Chloride 100 (*)    Glucose, Bld 128 (*)    Calcium 8.5 (*)    All other components within normal limits  URINALYSIS, ROUTINE W REFLEX MICROSCOPIC - Abnormal; Notable for the following:    Leukocytes, UA LARGE (*)    Bacteria, UA RARE (*)    Squamous Epithelial / LPF 0-5 (*)    All other components within normal limits  COMPREHENSIVE METABOLIC PANEL - Abnormal; Notable for the following:    Potassium 3.4 (*)    Calcium 8.7 (*)    Total Protein 6.2 (*)  All other components within normal limits  CBC WITH DIFFERENTIAL/PLATELET  TROPONIN I  PROTIME-INR  ETHANOL  RAPID URINE DRUG SCREEN, HOSP PERFORMED  APTT  TROPONIN I  TROPONIN I  CBC    EKG  EKG Interpretation  Date/Time:  Saturday September 08 2016 20:43:35 EDT Ventricular Rate:  79 PR Interval:    QRS Duration: 97 QT Interval:  376 QTC Calculation: 431 R Axis:   27 Text Interpretation:  Sinus rhythm LAE, consider biatrial enlargement Borderline repolarization abnormality Baseline wander in lead(s) V3 Confirmed by Ranae Palms  MD, Anuja Manka ([id number]) on 09/08/2016 8:55:10 PM       Radiology Dg Chest 2 View  Result Date: 09/09/2016 CLINICAL DATA:  Right-sided chest pain. EXAM: CHEST  2 VIEW COMPARISON:  Radiograph 447 FINDINGS: The cardiomediastinal contours are normal. Minimal right infrahilar atelectasis. Pulmonary vasculature is normal. No consolidation, pleural effusion, or pneumothorax. No acute osseous abnormalities are seen. IMPRESSION: Minimal right infrahilar atelectasis.  Otherwise nothing acute. Electronically Signed   By: Rubye Oaks M.D.   On: 09/09/2016 00:24   Ct Head Wo Contrast  Result Date: 09/08/2016 CLINICAL DATA:  Dizziness with under consciousness frontal. Of time. Blunt trauma by course. Bleeding at the nose and mouth with neck pain and headache. EXAM: CT HEAD  WITHOUT CONTRAST CT MAXILLOFACIAL WITHOUT CONTRAST CT CERVICAL SPINE WITHOUT CONTRAST TECHNIQUE: Multidetector CT imaging of the head, cervical spine, and maxillofacial structures were performed using the standard protocol without intravenous contrast. Multiplanar CT image reconstructions of the cervical spine and maxillofacial structures were also generated. COMPARISON:  None. FINDINGS: CT HEAD: BRAIN: There is sulcal and ventricular prominence consistent with superficial and central atrophy. No intraparenchymal hemorrhage, mass effect nor midline shift. Minimal periventricular and subcortical white matter hypodensities consistent with chronic small vessel ischemic disease are identified. No acute large vascular territory infarcts. No abnormal extra-axial fluid collections. Basal cisterns are not effaced and midline. VASCULAR: Moderate calcific atherosclerosis of the carotid siphons. SKULL: No skull fracture. No significant scalp soft tissue swelling. SINUSES/ORBITS: The mastoid air-cells are clear. The included paranasal sinuses are well-aerated with slight sinus atelectasis of the right maxillary sinus.The included ocular globes and orbital contents are non-suspicious. OTHER: None. CT MAXILLOFACIAL FINDINGS Osseous: No fracture or mandibular dislocation. No destructive process. Orbits: Negative. No traumatic or inflammatory finding. Sinuses: Sinus atelectasis of the right maxillary sinus. No air-fluid levels. Soft tissues: Negative. CT CERVICAL SPINE FINDINGS Alignment: Minimal grade 1 anterolisthesis of C2 on C3 by 2 mm. Craniocervical relationship and atlantodental interval are normal. Skull base and vertebrae: No acute fracture. No primary bone lesion or focal pathologic process. Soft tissues and spinal canal: No prevertebral fluid or swelling. No visible canal hematoma. Disc levels: Cervical spondylosis with moderate disc space narrowing at C2-3 and moderate-to-marked from C3 through T1. Mild-to-moderate  neural foraminal encroachment from osteophytes most marked at C3-4 and C4-5 on the left. Upper chest: Negative. Other: None IMPRESSION: 1. No acute intracranial abnormality or skull fracture. 2. Small right maxillary sinus consistent with sinus atelectasis. No acute sinus disease. No acute maxillofacial fracture. 3. Cervical spondylosis without acute fracture or posttraumatic subluxations. Electronically Signed   By: Tollie Eth M.D.   On: 09/08/2016 21:57   Ct Cervical Spine Wo Contrast  Result Date: 09/08/2016 CLINICAL DATA:  Dizziness with under consciousness frontal. Of time. Blunt trauma by course. Bleeding at the nose and mouth with neck pain and headache. EXAM: CT HEAD WITHOUT CONTRAST CT MAXILLOFACIAL WITHOUT CONTRAST CT CERVICAL SPINE WITHOUT CONTRAST TECHNIQUE: Multidetector  CT imaging of the head, cervical spine, and maxillofacial structures were performed using the standard protocol without intravenous contrast. Multiplanar CT image reconstructions of the cervical spine and maxillofacial structures were also generated. COMPARISON:  None. FINDINGS: CT HEAD: BRAIN: There is sulcal and ventricular prominence consistent with superficial and central atrophy. No intraparenchymal hemorrhage, mass effect nor midline shift. Minimal periventricular and subcortical white matter hypodensities consistent with chronic small vessel ischemic disease are identified. No acute large vascular territory infarcts. No abnormal extra-axial fluid collections. Basal cisterns are not effaced and midline. VASCULAR: Moderate calcific atherosclerosis of the carotid siphons. SKULL: No skull fracture. No significant scalp soft tissue swelling. SINUSES/ORBITS: The mastoid air-cells are clear. The included paranasal sinuses are well-aerated with slight sinus atelectasis of the right maxillary sinus.The included ocular globes and orbital contents are non-suspicious. OTHER: None. CT MAXILLOFACIAL FINDINGS Osseous: No fracture or  mandibular dislocation. No destructive process. Orbits: Negative. No traumatic or inflammatory finding. Sinuses: Sinus atelectasis of the right maxillary sinus. No air-fluid levels. Soft tissues: Negative. CT CERVICAL SPINE FINDINGS Alignment: Minimal grade 1 anterolisthesis of C2 on C3 by 2 mm. Craniocervical relationship and atlantodental interval are normal. Skull base and vertebrae: No acute fracture. No primary bone lesion or focal pathologic process. Soft tissues and spinal canal: No prevertebral fluid or swelling. No visible canal hematoma. Disc levels: Cervical spondylosis with moderate disc space narrowing at C2-3 and moderate-to-marked from C3 through T1. Mild-to-moderate neural foraminal encroachment from osteophytes most marked at C3-4 and C4-5 on the left. Upper chest: Negative. Other: None IMPRESSION: 1. No acute intracranial abnormality or skull fracture. 2. Small right maxillary sinus consistent with sinus atelectasis. No acute sinus disease. No acute maxillofacial fracture. 3. Cervical spondylosis without acute fracture or posttraumatic subluxations. Electronically Signed   By: Tollie Eth M.D.   On: 09/08/2016 21:57   Ct Maxillofacial Wo Contrast  Result Date: 09/08/2016 CLINICAL DATA:  Dizziness with under consciousness frontal. Of time. Blunt trauma by course. Bleeding at the nose and mouth with neck pain and headache. EXAM: CT HEAD WITHOUT CONTRAST CT MAXILLOFACIAL WITHOUT CONTRAST CT CERVICAL SPINE WITHOUT CONTRAST TECHNIQUE: Multidetector CT imaging of the head, cervical spine, and maxillofacial structures were performed using the standard protocol without intravenous contrast. Multiplanar CT image reconstructions of the cervical spine and maxillofacial structures were also generated. COMPARISON:  None. FINDINGS: CT HEAD: BRAIN: There is sulcal and ventricular prominence consistent with superficial and central atrophy. No intraparenchymal hemorrhage, mass effect nor midline shift. Minimal  periventricular and subcortical white matter hypodensities consistent with chronic small vessel ischemic disease are identified. No acute large vascular territory infarcts. No abnormal extra-axial fluid collections. Basal cisterns are not effaced and midline. VASCULAR: Moderate calcific atherosclerosis of the carotid siphons. SKULL: No skull fracture. No significant scalp soft tissue swelling. SINUSES/ORBITS: The mastoid air-cells are clear. The included paranasal sinuses are well-aerated with slight sinus atelectasis of the right maxillary sinus.The included ocular globes and orbital contents are non-suspicious. OTHER: None. CT MAXILLOFACIAL FINDINGS Osseous: No fracture or mandibular dislocation. No destructive process. Orbits: Negative. No traumatic or inflammatory finding. Sinuses: Sinus atelectasis of the right maxillary sinus. No air-fluid levels. Soft tissues: Negative. CT CERVICAL SPINE FINDINGS Alignment: Minimal grade 1 anterolisthesis of C2 on C3 by 2 mm. Craniocervical relationship and atlantodental interval are normal. Skull base and vertebrae: No acute fracture. No primary bone lesion or focal pathologic process. Soft tissues and spinal canal: No prevertebral fluid or swelling. No visible canal hematoma. Disc levels: Cervical spondylosis with  moderate disc space narrowing at C2-3 and moderate-to-marked from C3 through T1. Mild-to-moderate neural foraminal encroachment from osteophytes most marked at C3-4 and C4-5 on the left. Upper chest: Negative. Other: None IMPRESSION: 1. No acute intracranial abnormality or skull fracture. 2. Small right maxillary sinus consistent with sinus atelectasis. No acute sinus disease. No acute maxillofacial fracture. 3. Cervical spondylosis without acute fracture or posttraumatic subluxations. Electronically Signed   By: Tollie Eth M.D.   On: 09/08/2016 21:57    Procedures Procedures (including critical care time)  Medications Ordered in ED Medications - No data  to display   Initial Impression / Assessment and Plan / ED Course  I have reviewed the triage vital signs and the nursing notes.  Pertinent labs & imaging results that were available during my care of the patient were reviewed by me and considered in my medical decision making (see chart for details).    CT without acute findings. Vital signs are stable. Continues to be amnestic to events. Likely need admission for telemetry observation.   Final Clinical Impressions(s) / ED Diagnoses   Final diagnoses:  Syncope, unspecified syncope type  Injury of head, initial encounter  Amnesia  Contusion of face, initial encounter    New Prescriptions Discharge Medication List as of 09/09/2016  2:16 PM       Loren Racer, MD 09/09/16 [id number]

## 2016-09-08 NOTE — ED Notes (Signed)
Pt informed of need for urine sample but states that she cannot provide one at this time

## 2016-09-08 NOTE — ED Notes (Signed)
Pt unable to provide urine specimen at this time

## 2016-09-08 NOTE — ED Provider Notes (Signed)
Pt received at sign out with labs pending. Pt s/p syncopal episode, head injury. Amnestic to events. UA pending; workup otherwise reassuring. Dx and testing d/w pt and family.  Questions answered.  Verb understanding, agreeable to admit.  T/C to [facility] Dr. Sharl Ma, case discussed, including:  HPI, pertinent PM/SHx, VS/PE, dx testing, ED course and treatment:  Agreeable to admit.   BP (!) 151/75   Pulse 76   Temp 98.2 F (36.8 C) (Oral)   Resp (!) 24   Ht 5\' 3"  (1.6 m)   Wt 53.5 kg (118 lb)   SpO2 96%   BMI 20.90 kg/m   Results for orders placed or performed during the hospital encounter of 09/08/16  CBC with Differential/Platelet  Result Value Ref Range   WBC 6.9 4.0 - 10.5 K/uL   RBC 4.19 3.87 - 5.11 MIL/uL   Hemoglobin 13.3 12.0 - 15.0 g/dL   HCT 37.1 69.6 - 78.9 %   MCV 93.8 78.0 - 100.0 fL   MCH 31.7 26.0 - 34.0 pg   MCHC 33.8 30.0 - 36.0 g/dL   RDW 38.1 01.7 - 51.0 %   Platelets 206 150 - 400 K/uL   Neutrophils Relative % 81 %   Neutro Abs 5.6 1.7 - 7.7 K/uL   Lymphocytes Relative 14 %   Lymphs Abs 1.0 0.7 - 4.0 K/uL   Monocytes Relative 5 %   Monocytes Absolute 0.3 0.1 - 1.0 K/uL   Eosinophils Relative 0 %   Eosinophils Absolute 0.0 0.0 - 0.7 K/uL   Basophils Relative 0 %   Basophils Absolute 0.0 0.0 - 0.1 K/uL  Comprehensive metabolic panel  Result Value Ref Range   Sodium 133 (L) 135 - 145 mmol/L   Potassium 3.6 3.5 - 5.1 mmol/L   Chloride 100 (L) 101 - 111 mmol/L   CO2 23 22 - 32 mmol/L   Glucose, Bld 128 (H) 65 - 99 mg/dL   BUN 13 6 - 20 mg/dL   Creatinine, Ser 2.58 0.44 - 1.00 mg/dL   Calcium 8.5 (L) 8.9 - 10.3 mg/dL   Total Protein 6.9 6.5 - 8.1 g/dL   Albumin 4.6 3.5 - 5.0 g/dL   AST 22 15 - 41 U/L   ALT 20 14 - 54 U/L   Alkaline Phosphatase 58 38 - 126 U/L   Total Bilirubin 0.6 0.3 - 1.2 mg/dL   GFR calc non Af Amer >60 >60 mL/min   GFR calc Af Amer >60 >60 mL/min   Anion gap 10 5 - 15  Troponin I  Result Value Ref Range   Troponin I <0.03 <0.03  ng/mL  Protime-INR  Result Value Ref Range   Prothrombin Time 12.8 11.4 - 15.2 seconds   INR 0.96   Ethanol  Result Value Ref Range   Alcohol, Ethyl (B) <5 <5 mg/dL  APTT  Result Value Ref Range   aPTT 29 24 - 36 seconds   Ct Head Wo Contrast Result Date: 09/08/2016 CLINICAL DATA:  Dizziness with under consciousness frontal. Of time. Blunt trauma by course. Bleeding at the nose and mouth with neck pain and headache. EXAM: CT HEAD WITHOUT CONTRAST CT MAXILLOFACIAL WITHOUT CONTRAST CT CERVICAL SPINE WITHOUT CONTRAST TECHNIQUE: Multidetector CT imaging of the head, cervical spine, and maxillofacial structures were performed using the standard protocol without intravenous contrast. Multiplanar CT image reconstructions of the cervical spine and maxillofacial structures were also generated. COMPARISON:  None. FINDINGS: CT HEAD: BRAIN: There is sulcal and ventricular prominence consistent with  superficial and central atrophy. No intraparenchymal hemorrhage, mass effect nor midline shift. Minimal periventricular and subcortical white matter hypodensities consistent with chronic small vessel ischemic disease are identified. No acute large vascular territory infarcts. No abnormal extra-axial fluid collections. Basal cisterns are not effaced and midline. VASCULAR: Moderate calcific atherosclerosis of the carotid siphons. SKULL: No skull fracture. No significant scalp soft tissue swelling. SINUSES/ORBITS: The mastoid air-cells are clear. The included paranasal sinuses are well-aerated with slight sinus atelectasis of the right maxillary sinus.The included ocular globes and orbital contents are non-suspicious. OTHER: None. CT MAXILLOFACIAL FINDINGS Osseous: No fracture or mandibular dislocation. No destructive process. Orbits: Negative. No traumatic or inflammatory finding. Sinuses: Sinus atelectasis of the right maxillary sinus. No air-fluid levels. Soft tissues: Negative. CT CERVICAL SPINE FINDINGS Alignment:  Minimal grade 1 anterolisthesis of C2 on C3 by 2 mm. Craniocervical relationship and atlantodental interval are normal. Skull base and vertebrae: No acute fracture. No primary bone lesion or focal pathologic process. Soft tissues and spinal canal: No prevertebral fluid or swelling. No visible canal hematoma. Disc levels: Cervical spondylosis with moderate disc space narrowing at C2-3 and moderate-to-marked from C3 through T1. Mild-to-moderate neural foraminal encroachment from osteophytes most marked at C3-4 and C4-5 on the left. Upper chest: Negative. Other: None IMPRESSION: 1. No acute intracranial abnormality or skull fracture. 2. Small right maxillary sinus consistent with sinus atelectasis. No acute sinus disease. No acute maxillofacial fracture. 3. Cervical spondylosis without acute fracture or posttraumatic subluxations. Electronically Signed   By: Tollie Eth M.D.   On: 09/08/2016 21:57   Ct Cervical Spine Wo Contrast Result Date: 09/08/2016 CLINICAL DATA:  Dizziness with under consciousness frontal. Of time. Blunt trauma by course. Bleeding at the nose and mouth with neck pain and headache. EXAM: CT HEAD WITHOUT CONTRAST CT MAXILLOFACIAL WITHOUT CONTRAST CT CERVICAL SPINE WITHOUT CONTRAST TECHNIQUE: Multidetector CT imaging of the head, cervical spine, and maxillofacial structures were performed using the standard protocol without intravenous contrast. Multiplanar CT image reconstructions of the cervical spine and maxillofacial structures were also generated. COMPARISON:  None. FINDINGS: CT HEAD: BRAIN: There is sulcal and ventricular prominence consistent with superficial and central atrophy. No intraparenchymal hemorrhage, mass effect nor midline shift. Minimal periventricular and subcortical white matter hypodensities consistent with chronic small vessel ischemic disease are identified. No acute large vascular territory infarcts. No abnormal extra-axial fluid collections. Basal cisterns are not  effaced and midline. VASCULAR: Moderate calcific atherosclerosis of the carotid siphons. SKULL: No skull fracture. No significant scalp soft tissue swelling. SINUSES/ORBITS: The mastoid air-cells are clear. The included paranasal sinuses are well-aerated with slight sinus atelectasis of the right maxillary sinus.The included ocular globes and orbital contents are non-suspicious. OTHER: None. CT MAXILLOFACIAL FINDINGS Osseous: No fracture or mandibular dislocation. No destructive process. Orbits: Negative. No traumatic or inflammatory finding. Sinuses: Sinus atelectasis of the right maxillary sinus. No air-fluid levels. Soft tissues: Negative. CT CERVICAL SPINE FINDINGS Alignment: Minimal grade 1 anterolisthesis of C2 on C3 by 2 mm. Craniocervical relationship and atlantodental interval are normal. Skull base and vertebrae: No acute fracture. No primary bone lesion or focal pathologic process. Soft tissues and spinal canal: No prevertebral fluid or swelling. No visible canal hematoma. Disc levels: Cervical spondylosis with moderate disc space narrowing at C2-3 and moderate-to-marked from C3 through T1. Mild-to-moderate neural foraminal encroachment from osteophytes most marked at C3-4 and C4-5 on the left. Upper chest: Negative. Other: None IMPRESSION: 1. No acute intracranial abnormality or skull fracture. 2. Small right maxillary sinus consistent with  sinus atelectasis. No acute sinus disease. No acute maxillofacial fracture. 3. Cervical spondylosis without acute fracture or posttraumatic subluxations. Electronically Signed   By: Tollie Eth M.D.   On: 09/08/2016 21:57   Ct Maxillofacial Wo Contrast Result Date: 09/08/2016 CLINICAL DATA:  Dizziness with under consciousness frontal. Of time. Blunt trauma by course. Bleeding at the nose and mouth with neck pain and headache. EXAM: CT HEAD WITHOUT CONTRAST CT MAXILLOFACIAL WITHOUT CONTRAST CT CERVICAL SPINE WITHOUT CONTRAST TECHNIQUE: Multidetector CT imaging of  the head, cervical spine, and maxillofacial structures were performed using the standard protocol without intravenous contrast. Multiplanar CT image reconstructions of the cervical spine and maxillofacial structures were also generated. COMPARISON:  None. FINDINGS: CT HEAD: BRAIN: There is sulcal and ventricular prominence consistent with superficial and central atrophy. No intraparenchymal hemorrhage, mass effect nor midline shift. Minimal periventricular and subcortical white matter hypodensities consistent with chronic small vessel ischemic disease are identified. No acute large vascular territory infarcts. No abnormal extra-axial fluid collections. Basal cisterns are not effaced and midline. VASCULAR: Moderate calcific atherosclerosis of the carotid siphons. SKULL: No skull fracture. No significant scalp soft tissue swelling. SINUSES/ORBITS: The mastoid air-cells are clear. The included paranasal sinuses are well-aerated with slight sinus atelectasis of the right maxillary sinus.The included ocular globes and orbital contents are non-suspicious. OTHER: None. CT MAXILLOFACIAL FINDINGS Osseous: No fracture or mandibular dislocation. No destructive process. Orbits: Negative. No traumatic or inflammatory finding. Sinuses: Sinus atelectasis of the right maxillary sinus. No air-fluid levels. Soft tissues: Negative. CT CERVICAL SPINE FINDINGS Alignment: Minimal grade 1 anterolisthesis of C2 on C3 by 2 mm. Craniocervical relationship and atlantodental interval are normal. Skull base and vertebrae: No acute fracture. No primary bone lesion or focal pathologic process. Soft tissues and spinal canal: No prevertebral fluid or swelling. No visible canal hematoma. Disc levels: Cervical spondylosis with moderate disc space narrowing at C2-3 and moderate-to-marked from C3 through T1. Mild-to-moderate neural foraminal encroachment from osteophytes most marked at C3-4 and C4-5 on the left. Upper chest: Negative. Other: None  IMPRESSION: 1. No acute intracranial abnormality or skull fracture. 2. Small right maxillary sinus consistent with sinus atelectasis. No acute sinus disease. No acute maxillofacial fracture. 3. Cervical spondylosis without acute fracture or posttraumatic subluxations. Electronically Signed   By: Tollie Eth M.D.   On: 09/08/2016 21:57      Samuel Jester, DO 09/08/16 2231

## 2016-09-09 ENCOUNTER — Inpatient Hospital Stay (HOSPITAL_COMMUNITY): Payer: Medicare Other

## 2016-09-09 DIAGNOSIS — Z8249 Family history of ischemic heart disease and other diseases of the circulatory system: Secondary | ICD-10-CM | POA: Diagnosis not present

## 2016-09-09 DIAGNOSIS — R413 Other amnesia: Secondary | ICD-10-CM

## 2016-09-09 DIAGNOSIS — R55 Syncope and collapse: Secondary | ICD-10-CM | POA: Diagnosis not present

## 2016-09-09 DIAGNOSIS — I252 Old myocardial infarction: Secondary | ICD-10-CM | POA: Diagnosis not present

## 2016-09-09 DIAGNOSIS — R079 Chest pain, unspecified: Secondary | ICD-10-CM | POA: Diagnosis not present

## 2016-09-09 DIAGNOSIS — E785 Hyperlipidemia, unspecified: Secondary | ICD-10-CM | POA: Diagnosis present

## 2016-09-09 DIAGNOSIS — Z87891 Personal history of nicotine dependence: Secondary | ICD-10-CM | POA: Diagnosis not present

## 2016-09-09 DIAGNOSIS — I10 Essential (primary) hypertension: Secondary | ICD-10-CM

## 2016-09-09 DIAGNOSIS — Z7982 Long term (current) use of aspirin: Secondary | ICD-10-CM | POA: Diagnosis not present

## 2016-09-09 DIAGNOSIS — W5512XA Struck by horse, initial encounter: Secondary | ICD-10-CM | POA: Diagnosis not present

## 2016-09-09 DIAGNOSIS — S0083XA Contusion of other part of head, initial encounter: Secondary | ICD-10-CM | POA: Diagnosis not present

## 2016-09-09 DIAGNOSIS — I2510 Atherosclerotic heart disease of native coronary artery without angina pectoris: Secondary | ICD-10-CM | POA: Diagnosis present

## 2016-09-09 DIAGNOSIS — Z955 Presence of coronary angioplasty implant and graft: Secondary | ICD-10-CM | POA: Diagnosis not present

## 2016-09-09 LAB — COMPREHENSIVE METABOLIC PANEL
ALT: 15 U/L (ref 14–54)
ANION GAP: 8 (ref 5–15)
AST: 19 U/L (ref 15–41)
Albumin: 4 g/dL (ref 3.5–5.0)
Alkaline Phosphatase: 44 U/L (ref 38–126)
BILIRUBIN TOTAL: 0.5 mg/dL (ref 0.3–1.2)
BUN: 12 mg/dL (ref 6–20)
CHLORIDE: 101 mmol/L (ref 101–111)
CO2: 29 mmol/L (ref 22–32)
Calcium: 8.7 mg/dL — ABNORMAL LOW (ref 8.9–10.3)
Creatinine, Ser: 0.68 mg/dL (ref 0.44–1.00)
Glucose, Bld: 82 mg/dL (ref 65–99)
POTASSIUM: 3.4 mmol/L — AB (ref 3.5–5.1)
Sodium: 138 mmol/L (ref 135–145)
TOTAL PROTEIN: 6.2 g/dL — AB (ref 6.5–8.1)

## 2016-09-09 LAB — RAPID URINE DRUG SCREEN, HOSP PERFORMED
AMPHETAMINES: NOT DETECTED
BARBITURATES: NOT DETECTED
BENZODIAZEPINES: NOT DETECTED
COCAINE: NOT DETECTED
Opiates: NOT DETECTED
TETRAHYDROCANNABINOL: NOT DETECTED

## 2016-09-09 LAB — URINALYSIS, ROUTINE W REFLEX MICROSCOPIC
Bilirubin Urine: NEGATIVE
GLUCOSE, UA: NEGATIVE mg/dL
Hgb urine dipstick: NEGATIVE
Ketones, ur: NEGATIVE mg/dL
Nitrite: NEGATIVE
PROTEIN: NEGATIVE mg/dL
Specific Gravity, Urine: 1.008 (ref 1.005–1.030)
pH: 7 (ref 5.0–8.0)

## 2016-09-09 LAB — TROPONIN I

## 2016-09-09 LAB — ECHOCARDIOGRAM COMPLETE
HEIGHTINCHES: 63 in
WEIGHTICAEL: 1934.4 [oz_av]

## 2016-09-09 LAB — CBC
HEMATOCRIT: 37.2 % (ref 36.0–46.0)
HEMOGLOBIN: 12.4 g/dL (ref 12.0–15.0)
MCH: 31.4 pg (ref 26.0–34.0)
MCHC: 33.3 g/dL (ref 30.0–36.0)
MCV: 94.2 fL (ref 78.0–100.0)
PLATELETS: 214 10*3/uL (ref 150–400)
RBC: 3.95 MIL/uL (ref 3.87–5.11)
RDW: 13.3 % (ref 11.5–15.5)
WBC: 6.4 10*3/uL (ref 4.0–10.5)

## 2016-09-09 MED ORDER — ONDANSETRON HCL 4 MG/2ML IJ SOLN: 4.0000 mg | Freq: Four times a day (QID) | INTRAMUSCULAR | Status: DC | PRN

## 2016-09-09 MED ORDER — AMLODIPINE BESYLATE 5 MG PO TABS
2.5000 mg | ORAL_TABLET | Freq: Every day | ORAL | Status: DC
  Administered 2016-09-09: 2.5 mg via ORAL
  Filled 2016-09-09: qty 1

## 2016-09-09 MED ORDER — ENOXAPARIN SODIUM 40 MG/0.4ML ~~LOC~~ SOLN
40.0000 mg | SUBCUTANEOUS | Status: DC
  Administered 2016-09-09: 40 mg via SUBCUTANEOUS
  Filled 2016-09-09: qty 0.4

## 2016-09-09 MED ORDER — HYDROCHLOROTHIAZIDE 25 MG PO TABS
25.0000 mg | ORAL_TABLET | Freq: Every day | ORAL | Status: DC
Start: 2016-09-09 — End: 2016-09-09
  Administered 2016-09-09: 25 mg via ORAL
  Filled 2016-09-09: qty 1

## 2016-09-09 MED ORDER — LOSARTAN POTASSIUM 50 MG PO TABS
100.0000 mg | ORAL_TABLET | Freq: Every day | ORAL | Status: DC
  Administered 2016-09-09: 100 mg via ORAL
  Filled 2016-09-09: qty 2

## 2016-09-09 MED ORDER — ACETAMINOPHEN 325 MG PO TABS
650.0000 mg | ORAL_TABLET | Freq: Four times a day (QID) | ORAL | Status: DC | PRN
  Administered 2016-09-09 (×3): 650 mg via ORAL
  Filled 2016-09-09 (×3): qty 2

## 2016-09-09 MED ORDER — ONDANSETRON HCL 4 MG PO TABS: 4.0000 mg | ORAL_TABLET | Freq: Four times a day (QID) | ORAL | Status: DC | PRN

## 2016-09-09 MED ORDER — SODIUM CHLORIDE 0.9 % IV SOLN
INTRAVENOUS | Status: DC
  Administered 2016-09-09: 05:00:00 via INTRAVENOUS

## 2016-09-09 MED ORDER — SIMVASTATIN 20 MG PO TABS: 40.0000 mg | ORAL_TABLET | Freq: Every day | ORAL | Status: DC

## 2016-09-09 MED ORDER — ASPIRIN EC 81 MG PO TBEC
81.0000 mg | DELAYED_RELEASE_TABLET | Freq: Every day | ORAL | Status: DC
  Administered 2016-09-09: 81 mg via ORAL
  Filled 2016-09-09: qty 1

## 2016-09-09 MED ORDER — ACETAMINOPHEN 650 MG RE SUPP: 650.0000 mg | Freq: Four times a day (QID) | RECTAL | Status: DC | PRN

## 2016-09-09 MED ORDER — HYDROCODONE-ACETAMINOPHEN 5-325 MG PO TABS
1.0000 | ORAL_TABLET | Freq: Four times a day (QID) | ORAL | Status: DC | PRN
  Filled 2016-09-09: qty 1

## 2016-09-09 NOTE — ED Notes (Signed)
Pt and family made aware of the process of getting a bed. Pt and family had no other complaints. Family members given oj and gingerale upon request.

## 2016-09-09 NOTE — ED Notes (Signed)
Called to check on status of bed and floor states that the bed has not been approved at this time.  

## 2016-09-09 NOTE — Discharge Summary (Signed)
Physician Discharge Summary  [name]:[id number] DOB: 1945-12-18 DOA: 09/08/2016  PCP: Joycelyn Rua, MD  Admit date: 09/08/2016 Discharge date: 09/09/2016  Time spent: 45 minutes  Recommendations for Outpatient Follow-up:  -To be discharged home today. -Advised to follow-up with primary care provider 2 weeks. -Results of 2-D echo are pending and should be reviewed at time of hospital follow-up.   Discharge Diagnoses:  Active Problems:   Essential hypertension   Amnesia   Amnesia memory loss   Discharge Condition: Stable and improved  Filed Weights   09/08/16 2019 09/09/16 0418  Weight: 53.5 kg (118 lb) 54.8 kg (120 lb 14.4 oz)    History of present illness:  As per Dr. Sharl Ma on 8/26: [name]  is a 71 y.o. female, With history of CAD status post DES to LAD, diverticulosis, hypertension, hyperlipidemia was brought to hospital by patient's family friends when they noticed that patient was confused at dinner tonight. Patient has no recollection of events and was initially confused, and now she is mentally back to her baseline but still has no recollection of events between 1 PM to 6 PM. 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 Patient denies urinary incontinence or tongue biting. No history of seizures. She denies chest pain, no shortness of breath. She denies nausea vomiting or diarrhea. No previous history of syncope or seizures. In the ED CT scan of the head,  cervical spine, CT maxillofacial were all negative.  Hospital Course:   Transient memory loss -Patient does not think she had a syncopal event. -Etiology remains unclear, no arrhythmias on telemetry, CT scan of the head maxillofacial and C-spine are negative. -Patient does have a contusion over her left lip and chin and  some right rib cage pain. 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 I wonder if she might have been kicked by the horse and suffered a slight concussion. -She is to be discharged home today, results of 2-D echo are pending and should be reviewed at hospital follow-up, she has friends that will check in on her over the next 2-3 days. -She is currently back to normal.   Procedures:  None   Consultations:  None  Discharge Instructions  Discharge Instructions    Diet - low sodium heart healthy    Complete by:  As directed    Increase activity slowly    Complete by:  As directed      Allergies as of 09/09/2016   No Known Allergies     Medication List    STOP taking these medications   AMBULATORY NON FORMULARY MEDICATION   hydrochlorothiazide 25 MG tablet Commonly known as:  HYDRODIURIL   losartan 100 MG tablet Commonly known as:  COZAAR     TAKE these medications   amLODipine 2.5 MG tablet Commonly known as:  NORVASC Take 1 tablet (2.5 mg total) by mouth daily.   aspirin EC 81 MG tablet Take 1 tablet (81 mg total) by mouth daily.   nitroGLYCERIN 0.4 MG SL tablet Commonly known as:  NITROSTAT Place 1 tablet (0.4 mg total) under the tongue every 5 (five) minutes as needed.   simvastatin 40 MG tablet Commonly known as:  ZOCOR Take 1 tablet (40 mg total)  by mouth daily at 6 PM.   SUPER B COMPLEX PO Take 1 tablet by mouth daily.            Discharge Care Instructions        Start     Ordered   09/09/16 0000  Increase activity slowly     09/09/16 1233   09/09/16 0000  Diet - low sodium heart healthy     09/09/16 1233     No Known Allergies Follow-up Information    Joycelyn Rua, MD. Schedule an appointment as soon as possible for a visit in 2 week(s).   Specialty:  Family Medicine Contact information: [address] [place] [address] [phone]            The results of  significant diagnostics from this hospitalization (including imaging, microbiology, ancillary and laboratory) are listed below for reference.    Significant Diagnostic Studies: Dg Chest 2 View  Result Date: 09/09/2016 CLINICAL DATA:  Right-sided chest pain. EXAM: CHEST  2 VIEW COMPARISON:  Radiograph 447 FINDINGS: The cardiomediastinal contours are normal. Minimal right infrahilar atelectasis. Pulmonary vasculature is normal. No consolidation, pleural effusion, or pneumothorax. No acute osseous abnormalities are seen. IMPRESSION: Minimal right infrahilar atelectasis.  Otherwise nothing acute. Electronically Signed   By: Rubye Oaks M.D.   On: 09/09/2016 00:24   Ct Head Wo Contrast  Result Date: 09/08/2016 CLINICAL DATA:  Dizziness with under consciousness frontal. Of time. Blunt trauma by course. Bleeding at the nose and mouth with neck pain and headache. EXAM: CT HEAD WITHOUT CONTRAST CT MAXILLOFACIAL WITHOUT CONTRAST CT CERVICAL SPINE WITHOUT CONTRAST TECHNIQUE: Multidetector CT imaging of the head, cervical spine, and maxillofacial structures were performed using the standard protocol without intravenous contrast. Multiplanar CT image reconstructions of the cervical spine and maxillofacial structures were also generated. COMPARISON:  None. FINDINGS: CT HEAD: BRAIN: There is sulcal and ventricular prominence consistent with superficial and central atrophy. No intraparenchymal hemorrhage, mass effect nor midline shift. Minimal periventricular and subcortical white matter hypodensities consistent with chronic small vessel ischemic disease are identified. No acute large vascular territory infarcts. No abnormal extra-axial fluid collections. Basal cisterns are not effaced and midline. VASCULAR: Moderate calcific atherosclerosis of the carotid siphons. SKULL: No skull fracture. No significant scalp soft tissue swelling. SINUSES/ORBITS: The mastoid air-cells are clear. The included paranasal sinuses are  well-aerated with slight sinus atelectasis of the right maxillary sinus.The included ocular globes and orbital contents are non-suspicious. OTHER: None. CT MAXILLOFACIAL FINDINGS Osseous: No fracture or mandibular dislocation. No destructive process. Orbits: Negative. No traumatic or inflammatory finding. Sinuses: Sinus atelectasis of the right maxillary sinus. No air-fluid levels. Soft tissues: Negative. CT CERVICAL SPINE FINDINGS Alignment: Minimal grade 1 anterolisthesis of C2 on C3 by 2 mm. Craniocervical relationship and atlantodental interval are normal. Skull base and vertebrae: No acute fracture. No primary bone lesion or focal pathologic process. Soft tissues and spinal canal: No prevertebral fluid or swelling. No visible canal hematoma. Disc levels: Cervical spondylosis with moderate disc space narrowing at C2-3 and moderate-to-marked from C3 through T1. Mild-to-moderate neural foraminal encroachment from osteophytes most marked at C3-4 and C4-5 on the left. Upper chest: Negative. Other: None IMPRESSION: 1. No acute intracranial abnormality or skull fracture. 2. Small right maxillary sinus consistent with sinus atelectasis. No acute sinus disease. No acute maxillofacial fracture. 3. Cervical spondylosis without acute fracture or posttraumatic subluxations. Electronically Signed   By: Tollie Eth M.D.   On: 09/08/2016 21:57   Ct  Cervical Spine Wo Contrast  Result Date: 09/08/2016 CLINICAL DATA:  Dizziness with under consciousness frontal. Of time. Blunt trauma by course. Bleeding at the nose and mouth with neck pain and headache. EXAM: CT HEAD WITHOUT CONTRAST CT MAXILLOFACIAL WITHOUT CONTRAST CT CERVICAL SPINE WITHOUT CONTRAST TECHNIQUE: Multidetector CT imaging of the head, cervical spine, and maxillofacial structures were performed using the standard protocol without intravenous contrast. Multiplanar CT image reconstructions of the cervical spine and maxillofacial structures were also generated.  COMPARISON:  None. FINDINGS: CT HEAD: BRAIN: There is sulcal and ventricular prominence consistent with superficial and central atrophy. No intraparenchymal hemorrhage, mass effect nor midline shift. Minimal periventricular and subcortical white matter hypodensities consistent with chronic small vessel ischemic disease are identified. No acute large vascular territory infarcts. No abnormal extra-axial fluid collections. Basal cisterns are not effaced and midline. VASCULAR: Moderate calcific atherosclerosis of the carotid siphons. SKULL: No skull fracture. No significant scalp soft tissue swelling. SINUSES/ORBITS: The mastoid air-cells are clear. The included paranasal sinuses are well-aerated with slight sinus atelectasis of the right maxillary sinus.The included ocular globes and orbital contents are non-suspicious. OTHER: None. CT MAXILLOFACIAL FINDINGS Osseous: No fracture or mandibular dislocation. No destructive process. Orbits: Negative. No traumatic or inflammatory finding. Sinuses: Sinus atelectasis of the right maxillary sinus. No air-fluid levels. Soft tissues: Negative. CT CERVICAL SPINE FINDINGS Alignment: Minimal grade 1 anterolisthesis of C2 on C3 by 2 mm. Craniocervical relationship and atlantodental interval are normal. Skull base and vertebrae: No acute fracture. No primary bone lesion or focal pathologic process. Soft tissues and spinal canal: No prevertebral fluid or swelling. No visible canal hematoma. Disc levels: Cervical spondylosis with moderate disc space narrowing at C2-3 and moderate-to-marked from C3 through T1. Mild-to-moderate neural foraminal encroachment from osteophytes most marked at C3-4 and C4-5 on the left. Upper chest: Negative. Other: None IMPRESSION: 1. No acute intracranial abnormality or skull fracture. 2. Small right maxillary sinus consistent with sinus atelectasis. No acute sinus disease. No acute maxillofacial fracture. 3. Cervical spondylosis without acute fracture or  posttraumatic subluxations. Electronically Signed   By: Tollie Eth M.D.   On: 09/08/2016 21:57   Ct Maxillofacial Wo Contrast  Result Date: 09/08/2016 CLINICAL DATA:  Dizziness with under consciousness frontal. Of time. Blunt trauma by course. Bleeding at the nose and mouth with neck pain and headache. EXAM: CT HEAD WITHOUT CONTRAST CT MAXILLOFACIAL WITHOUT CONTRAST CT CERVICAL SPINE WITHOUT CONTRAST TECHNIQUE: Multidetector CT imaging of the head, cervical spine, and maxillofacial structures were performed using the standard protocol without intravenous contrast. Multiplanar CT image reconstructions of the cervical spine and maxillofacial structures were also generated. COMPARISON:  None. FINDINGS: CT HEAD: BRAIN: There is sulcal and ventricular prominence consistent with superficial and central atrophy. No intraparenchymal hemorrhage, mass effect nor midline shift. Minimal periventricular and subcortical white matter hypodensities consistent with chronic small vessel ischemic disease are identified. No acute large vascular territory infarcts. No abnormal extra-axial fluid collections. Basal cisterns are not effaced and midline. VASCULAR: Moderate calcific atherosclerosis of the carotid siphons. SKULL: No skull fracture. No significant scalp soft tissue swelling. SINUSES/ORBITS: The mastoid air-cells are clear. The included paranasal sinuses are well-aerated with slight sinus atelectasis of the right maxillary sinus.The included ocular globes and orbital contents are non-suspicious. OTHER: None. CT MAXILLOFACIAL FINDINGS Osseous: No fracture or mandibular dislocation. No destructive process. Orbits: Negative. No traumatic or inflammatory finding. Sinuses: Sinus atelectasis of the right maxillary sinus. No air-fluid levels. Soft tissues: Negative. CT CERVICAL SPINE FINDINGS Alignment: Minimal grade 1  anterolisthesis of C2 on C3 by 2 mm. Craniocervical relationship and atlantodental interval are normal. Skull  base and vertebrae: No acute fracture. No primary bone lesion or focal pathologic process. Soft tissues and spinal canal: No prevertebral fluid or swelling. No visible canal hematoma. Disc levels: Cervical spondylosis with moderate disc space narrowing at C2-3 and moderate-to-marked from C3 through T1. Mild-to-moderate neural foraminal encroachment from osteophytes most marked at C3-4 and C4-5 on the left. Upper chest: Negative. Other: None IMPRESSION: 1. No acute intracranial abnormality or skull fracture. 2. Small right maxillary sinus consistent with sinus atelectasis. No acute sinus disease. No acute maxillofacial fracture. 3. Cervical spondylosis without acute fracture or posttraumatic subluxations. Electronically Signed   By: Tollie Eth M.D.   On: 09/08/2016 21:57    Microbiology: No results found for this or any previous visit (from the past 240 hour(s)).   Labs: Basic Metabolic Panel:  Recent Labs Lab 09/08/16 2116 09/09/16 0428  NA 133* 138  K 3.6 3.4*  CL 100* 101  CO2 23 29  GLUCOSE 128* 82  BUN 13 12  CREATININE 0.74 0.68  CALCIUM 8.5* 8.7*   Liver Function Tests:  Recent Labs Lab 09/08/16 2116 09/09/16 0428  AST 22 19  ALT 20 15  ALKPHOS 58 44  BILITOT 0.6 0.5  PROT 6.9 6.2*  ALBUMIN 4.6 4.0   No results for input(s): LIPASE, AMYLASE in the last 168 hours. No results for input(s): AMMONIA in the last 168 hours. CBC:  Recent Labs Lab 09/08/16 2116 09/09/16 0428  WBC 6.9 6.4  NEUTROABS 5.6  --   HGB 13.3 12.4  HCT 39.3 37.2  MCV 93.8 94.2  PLT 206 214   Cardiac Enzymes:  Recent Labs Lab 09/08/16 2116 09/09/16 0428 09/09/16 0825  TROPONINI <0.03 <0.03 <0.03   BNP: BNP (last 3 results) No results for input(s): BNP in the last 8760 hours.  ProBNP (last 3 results) No results for input(s): PROBNP in the last 8760 hours.  CBG: No results for input(s): GLUCAP in the last 168 hours.     SignedChaya Jan  [facility] Pager: [phone] 09/09/2016, 12:34 PM

## 2016-09-09 NOTE — ED Notes (Signed)
Family concerned about patient stating that she has a headache at this time. Patient and family wanting to know when she will get bed. Lawson Fiscal RN is at bedside at this time to speak with family concerning this issue.

## 2016-09-09 NOTE — H&P (Signed)
TRH H&P    Patient Demographics:    [name], is a 71 y.o. female  MRN: [id number]  DOB - 1945/08/13  Admit Date - 09/08/2016  Referring MD/NP/PA: Dr. Clarene Duke  Outpatient Primary MD for the patient is Joycelyn Rua, MD  Patient coming from: Home  Chief Complaint  Patient presents with  . Facial Injury      HPI:    [name]  is a 71 y.o. female, With history of CAD status post DES to LAD, diverticulosis, hypertension, hyperlipidemia was brought to hospital by patient's family friends when they noticed that patient was confused at dinner tonight. Patient has no recollection of events and was initially confused, and now she is mentally back to her baseline but still has no recollection of events between 1 PM to 6 PM. 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 Patient denies urinary incontinence or tongue biting. No history of seizures. She denies chest pain, no shortness of breath. She denies nausea vomiting or diarrhea. No previous history of syncope or seizures. In the ED CT scan of the head,  cervical spine, CT maxillofacial were all negative.      Review of systems:    In addition to the HPI above,  No Fever-chills, No Headache, No changes with Vision or hearing, No problems swallowing food or Liquids, No Chest pain, Cough or Shortness of Breath, No Abdominal pain, No Nausea or Vomiting, bowel movements are regular, No Blood in stool or Urine, No dysuria, No new skin rashes or bruises, No new joints pains-aches,  No new weakness, tingling, numbness in any extremity, No recent weight gain or loss, No polyuria, polydypsia or polyphagia, No significant Mental Stressors.  All other systems reviewed and are  negative.   With Past History of the following :    Past Medical History:  Diagnosis Date  . Coronary artery disease 2005   a. s/p Ant STEMI 10/05 tx with Taxus DES to LAD;  b. cath 10/05: LM ok, LAD 99% tx with PCI, CFX ok, pRCA 50%, EF 30%;  c. echo 10/05: EF 55-60%, apical septal HK;  d. myoview 4/07: no scar or ischemia  . Diverticulosis   . Hyperlipidemia   . Hypertension       Past Surgical History:  Procedure Laterality Date  . CARDIAC CATHETERIZATION  2005   TAXUS STENT LEFT ANTERIOR DESCENDING  . COLONOSCOPY  12/22/2001   Dr. Stan Head  . THUMB ARTHROSCOPY Right       Social History:      Social History  Substance Use Topics  . Smoking status: Former Smoker    Packs/day: 0.50    Types: Cigarettes    Quit date: 01/15/1986  . Smokeless tobacco: Never Used  . Alcohol use 1.2 oz/week    1 Glasses of wine, 1 Cans of beer per week  Comment: BEER OR A GLASS OF WINE MOST NIGHTS       Family History :     Family History  Problem Relation Age of Onset  . Heart disease Mother   . Pancreatic cancer Father   . Heart disease Brother 40       HAD 3 MI's THAT STARTED IN HIS 40's HE IS NOW 59  . Coronary artery disease Sister       Home Medications:   Prior to Admission medications   Medication Sig Start Date End Date Taking? Authorizing Provider  AMBULATORY NON FORMULARY MEDICATION Take 1-2 capsules by mouth daily. Digestive Advantage 1 tablet by mouth 1-2 times per day    Yes [provider]  amLODipine (NORVASC) 2.5 MG tablet Take 1 tablet (2.5 mg total) by mouth daily. 07/10/16 07/10/17 Yes Rosalio Macadamia, NP  aspirin EC 81 MG tablet Take 1 tablet (81 mg total) by mouth daily. 06/01/14  Yes Rosalio Macadamia, NP  B Complex-C (SUPER B COMPLEX PO) Take 1 tablet by mouth daily.    Yes [provider]  losartan (COZAAR) 100 MG tablet Take 1 tablet (100 mg total) by mouth daily. 06/06/16 09/08/16 Yes Rosalio Macadamia, NP  nitroGLYCERIN  (NITROSTAT) 0.4 MG SL tablet Place 1 tablet (0.4 mg total) under the tongue every 5 (five) minutes as needed. 05/31/15  Yes Rosalio Macadamia, NP  simvastatin (ZOCOR) 40 MG tablet Take 1 tablet (40 mg total) by mouth daily at 6 PM. 06/06/16  Yes Rosalio Macadamia, NP  hydrochlorothiazide (HYDRODIURIL) 25 MG tablet Take 1 tablet (25 mg total) by mouth daily. Patient not taking: Reported on 09/08/2016 06/25/16 09/23/16  Rosalio Macadamia, NP     Allergies:    No Known Allergies   Physical Exam:   Vitals  Blood pressure (!) 151/67, pulse 62, temperature 98.2 F (36.8 C), temperature source Oral, resp. rate 18, height 5\' 3"  (1.6 m), weight 53.5 kg (118 lb), SpO2 98 %.  1.  General: Appears in no acute distress  2. Psychiatric:  Intact judgement and  insight, awake alert, oriented x 3.  3. Neurologic: No focal neurological deficits, all cranial nerves intact.Strength 5/5 all 4 extremities, sensation intact all 4 extremities, plantars down going.  4. Eyes :  anicteric sclerae, moist conjunctivae with no lid lag. PERRLA.  5. ENMT:  Oropharynx clear with moist mucous membranes and good dentition. Contusion noted on the chin and on the mucosal surface of lower lip.  6. Neck:  supple, no cervical lymphadenopathy appriciated, No thyromegaly  7. Respiratory : Normal respiratory effort, good air movement bilaterally,clear to  auscultation bilaterally  8. Cardiovascular : RRR, no gallops, rubs or murmurs, no leg edema  9. Gastrointestinal:  Positive bowel sounds, abdomen soft, non-tender to palpation,no hepatosplenomegaly, no rigidity or guarding       10. Skin:  No cyanosis, normal texture and turgor, no rash, lesions or ulcers  11.Musculoskeletal:  Good muscle tone,  joints appear normal , no effusions,  normal range of motion    Data Review:    CBC  Recent Labs Lab 09/08/16 2116  WBC 6.9  HGB 13.3  HCT 39.3  PLT 206  MCV 93.8  MCH 31.7  MCHC 33.8  RDW 13.4  LYMPHSABS 1.0   MONOABS 0.3  EOSABS 0.0  BASOSABS 0.0   ------------------------------------------------------------------------------------------------------------------  Chemistries   Recent Labs Lab 09/08/16 2116  NA 133*  K 3.6  CL 100*  CO2 23  GLUCOSE 128*  BUN 13  CREATININE 0.74  CALCIUM 8.5*  AST 22  ALT 20  ALKPHOS 58  BILITOT 0.6   ------------------------------------------------------------------------------------------------------------------  ------------------------------------------------------------------------------------------------------------------ GFR: Estimated Creatinine Clearance: 54.9 mL/min (by C-G formula based on SCr of 0.74 mg/dL). Liver Function Tests:  Recent Labs Lab 09/08/16 2116  AST 22  ALT 20  ALKPHOS 58  BILITOT 0.6  PROT 6.9  ALBUMIN 4.6   No results for input(s): LIPASE, AMYLASE in the last 168 hours. No results for input(s): AMMONIA in the last 168 hours. Coagulation Profile:  Recent Labs Lab 09/08/16 2116  INR 0.96   Cardiac Enzymes:  Recent Labs Lab 09/08/16 2116  TROPONINI <0.03   BNP (last 3 results) No results for input(s): PROBNP in the last 8760 hours. HbA1C: No results for input(s): HGBA1C in the last 72 hours. CBG: No results for input(s): GLUCAP in the last 168 hours. Lipid Profile: No results for input(s): CHOL, HDL, LDLCALC, TRIG, CHOLHDL, LDLDIRECT in the last 72 hours. Thyroid Function Tests: No results for input(s): TSH, T4TOTAL, FREET4, T3FREE, THYROIDAB in the last 72 hours. Anemia Panel: No results for input(s): VITAMINB12, FOLATE, FERRITIN, TIBC, IRON, RETICCTPCT in the last 72 hours.  --------------------------------------------------------------------------------------------------------------- Urine analysis: No results found for: COLORURINE, APPEARANCEUR, LABSPEC, PHURINE, GLUCOSEU, HGBUR, BILIRUBINUR, KETONESUR, PROTEINUR, UROBILINOGEN, NITRITE, LEUKOCYTESUR    Imaging Results:    Ct Head Wo  Contrast  Result Date: 09/08/2016 CLINICAL DATA:  Dizziness with under consciousness frontal. Of time. Blunt trauma by course. Bleeding at the nose and mouth with neck pain and headache. EXAM: CT HEAD WITHOUT CONTRAST CT MAXILLOFACIAL WITHOUT CONTRAST CT CERVICAL SPINE WITHOUT CONTRAST TECHNIQUE: Multidetector CT imaging of the head, cervical spine, and maxillofacial structures were performed using the standard protocol without intravenous contrast. Multiplanar CT image reconstructions of the cervical spine and maxillofacial structures were also generated. COMPARISON:  None. FINDINGS: CT HEAD: BRAIN: There is sulcal and ventricular prominence consistent with superficial and central atrophy. No intraparenchymal hemorrhage, mass effect nor midline shift. Minimal periventricular and subcortical white matter hypodensities consistent with chronic small vessel ischemic disease are identified. No acute large vascular territory infarcts. No abnormal extra-axial fluid collections. Basal cisterns are not effaced and midline. VASCULAR: Moderate calcific atherosclerosis of the carotid siphons. SKULL: No skull fracture. No significant scalp soft tissue swelling. SINUSES/ORBITS: The mastoid air-cells are clear. The included paranasal sinuses are well-aerated with slight sinus atelectasis of the right maxillary sinus.The included ocular globes and orbital contents are non-suspicious. OTHER: None. CT MAXILLOFACIAL FINDINGS Osseous: No fracture or mandibular dislocation. No destructive process. Orbits: Negative. No traumatic or inflammatory finding. Sinuses: Sinus atelectasis of the right maxillary sinus. No air-fluid levels. Soft tissues: Negative. CT CERVICAL SPINE FINDINGS Alignment: Minimal grade 1 anterolisthesis of C2 on C3 by 2 mm. Craniocervical relationship and atlantodental interval are normal. Skull base and vertebrae: No acute fracture. No primary bone lesion or focal pathologic process. Soft tissues and spinal canal:  No prevertebral fluid or swelling. No visible canal hematoma. Disc levels: Cervical spondylosis with moderate disc space narrowing at C2-3 and moderate-to-marked from C3 through T1. Mild-to-moderate neural foraminal encroachment from osteophytes most marked at C3-4 and C4-5 on the left. Upper chest: Negative. Other: None IMPRESSION: 1. No acute intracranial abnormality or skull fracture. 2. Small right maxillary sinus consistent with sinus atelectasis. No acute sinus disease. No acute maxillofacial fracture. 3. Cervical spondylosis without acute fracture or posttraumatic subluxations. Electronically Signed   By: Tollie Eth M.D.   On: 09/08/2016 21:57  Ct Cervical Spine Wo Contrast  Result Date: 09/08/2016 CLINICAL DATA:  Dizziness with under consciousness frontal. Of time. Blunt trauma by course. Bleeding at the nose and mouth with neck pain and headache. EXAM: CT HEAD WITHOUT CONTRAST CT MAXILLOFACIAL WITHOUT CONTRAST CT CERVICAL SPINE WITHOUT CONTRAST TECHNIQUE: Multidetector CT imaging of the head, cervical spine, and maxillofacial structures were performed using the standard protocol without intravenous contrast. Multiplanar CT image reconstructions of the cervical spine and maxillofacial structures were also generated. COMPARISON:  None. FINDINGS: CT HEAD: BRAIN: There is sulcal and ventricular prominence consistent with superficial and central atrophy. No intraparenchymal hemorrhage, mass effect nor midline shift. Minimal periventricular and subcortical white matter hypodensities consistent with chronic small vessel ischemic disease are identified. No acute large vascular territory infarcts. No abnormal extra-axial fluid collections. Basal cisterns are not effaced and midline. VASCULAR: Moderate calcific atherosclerosis of the carotid siphons. SKULL: No skull fracture. No significant scalp soft tissue swelling. SINUSES/ORBITS: The mastoid air-cells are clear. The included paranasal sinuses are  well-aerated with slight sinus atelectasis of the right maxillary sinus.The included ocular globes and orbital contents are non-suspicious. OTHER: None. CT MAXILLOFACIAL FINDINGS Osseous: No fracture or mandibular dislocation. No destructive process. Orbits: Negative. No traumatic or inflammatory finding. Sinuses: Sinus atelectasis of the right maxillary sinus. No air-fluid levels. Soft tissues: Negative. CT CERVICAL SPINE FINDINGS Alignment: Minimal grade 1 anterolisthesis of C2 on C3 by 2 mm. Craniocervical relationship and atlantodental interval are normal. Skull base and vertebrae: No acute fracture. No primary bone lesion or focal pathologic process. Soft tissues and spinal canal: No prevertebral fluid or swelling. No visible canal hematoma. Disc levels: Cervical spondylosis with moderate disc space narrowing at C2-3 and moderate-to-marked from C3 through T1. Mild-to-moderate neural foraminal encroachment from osteophytes most marked at C3-4 and C4-5 on the left. Upper chest: Negative. Other: None IMPRESSION: 1. No acute intracranial abnormality or skull fracture. 2. Small right maxillary sinus consistent with sinus atelectasis. No acute sinus disease. No acute maxillofacial fracture. 3. Cervical spondylosis without acute fracture or posttraumatic subluxations. Electronically Signed   By: Tollie Eth M.D.   On: 09/08/2016 21:57   Ct Maxillofacial Wo Contrast  Result Date: 09/08/2016 CLINICAL DATA:  Dizziness with under consciousness frontal. Of time. Blunt trauma by course. Bleeding at the nose and mouth with neck pain and headache. EXAM: CT HEAD WITHOUT CONTRAST CT MAXILLOFACIAL WITHOUT CONTRAST CT CERVICAL SPINE WITHOUT CONTRAST TECHNIQUE: Multidetector CT imaging of the head, cervical spine, and maxillofacial structures were performed using the standard protocol without intravenous contrast. Multiplanar CT image reconstructions of the cervical spine and maxillofacial structures were also generated.  COMPARISON:  None. FINDINGS: CT HEAD: BRAIN: There is sulcal and ventricular prominence consistent with superficial and central atrophy. No intraparenchymal hemorrhage, mass effect nor midline shift. Minimal periventricular and subcortical white matter hypodensities consistent with chronic small vessel ischemic disease are identified. No acute large vascular territory infarcts. No abnormal extra-axial fluid collections. Basal cisterns are not effaced and midline. VASCULAR: Moderate calcific atherosclerosis of the carotid siphons. SKULL: No skull fracture. No significant scalp soft tissue swelling. SINUSES/ORBITS: The mastoid air-cells are clear. The included paranasal sinuses are well-aerated with slight sinus atelectasis of the right maxillary sinus.The included ocular globes and orbital contents are non-suspicious. OTHER: None. CT MAXILLOFACIAL FINDINGS Osseous: No fracture or mandibular dislocation. No destructive process. Orbits: Negative. No traumatic or inflammatory finding. Sinuses: Sinus atelectasis of the right maxillary sinus. No air-fluid levels. Soft tissues: Negative. CT CERVICAL SPINE FINDINGS Alignment: Minimal grade  1 anterolisthesis of C2 on C3 by 2 mm. Craniocervical relationship and atlantodental interval are normal. Skull base and vertebrae: No acute fracture. No primary bone lesion or focal pathologic process. Soft tissues and spinal canal: No prevertebral fluid or swelling. No visible canal hematoma. Disc levels: Cervical spondylosis with moderate disc space narrowing at C2-3 and moderate-to-marked from C3 through T1. Mild-to-moderate neural foraminal encroachment from osteophytes most marked at C3-4 and C4-5 on the left. Upper chest: Negative. Other: None IMPRESSION: 1. No acute intracranial abnormality or skull fracture. 2. Small right maxillary sinus consistent with sinus atelectasis. No acute sinus disease. No acute maxillofacial fracture. 3. Cervical spondylosis without acute fracture or  posttraumatic subluxations. Electronically Signed   By: Tollie Eth M.D.   On: 09/08/2016 21:57    My personal review of EKG: Rhythm NSR   Assessment & Plan:    Active Problems:   Essential hypertension   Amnesia   1. Amnesia- unclear etiology, likely patient suffered loss of consciousness after head by a horse, or had syncope versus seizure. We will obtain echocardiogram, EEG in a.m. Monitor on telemetry. Will obtain serial cardiac enzymes. 2. ? Syncope- will check echo as above, troponin every 6 hours 3. Monitor closely on telemetry. 3. Hypertension- continue amlodipine, HCTZ, Cozaar. 4. CAD-continue aspirin.   DVT Prophylaxis-   Lovenox   AM Labs Ordered, also please review Full Orders  Family Communication: Admission, patients condition and plan of care including tests being ordered have been discussed with the patient and Family members at bedside who indicate understanding and agree with the plan and Code Status.  Code Status:  Full code  Admission status: Inpatient    Time spent in minutes : 65 minutes   Michai Dieppa S M.D on 09/09/2016 at 12:15 AM  Between 7am to 7pm - Pager - [phone]. After 7pm go to [email or web address] - password [email or web address]  [facility] - Office  [phone]

## 2016-09-09 NOTE — Progress Notes (Signed)
*  PRELIMINARY RESULTS* Echocardiogram 2D Echocardiogram has been performed.  [name] 09/09/2016, 11:45 AM

## 2016-09-12 DIAGNOSIS — I2510 Atherosclerotic heart disease of native coronary artery without angina pectoris: Secondary | ICD-10-CM | POA: Diagnosis not present

## 2016-09-12 DIAGNOSIS — Z1211 Encounter for screening for malignant neoplasm of colon: Secondary | ICD-10-CM | POA: Diagnosis not present

## 2016-09-12 DIAGNOSIS — G454 Transient global amnesia: Secondary | ICD-10-CM | POA: Diagnosis not present

## 2016-09-12 DIAGNOSIS — Z0000 Encounter for general adult medical examination without abnormal findings: Secondary | ICD-10-CM | POA: Diagnosis not present

## 2016-09-12 DIAGNOSIS — Z23 Encounter for immunization: Secondary | ICD-10-CM | POA: Diagnosis not present

## 2016-09-12 DIAGNOSIS — I10 Essential (primary) hypertension: Secondary | ICD-10-CM | POA: Diagnosis not present

## 2016-09-12 DIAGNOSIS — E7800 Pure hypercholesterolemia, unspecified: Secondary | ICD-10-CM | POA: Diagnosis not present

## 2016-09-14 ENCOUNTER — Encounter: Payer: Self-pay | Admitting: Internal Medicine

## 2016-09-24 DIAGNOSIS — H11153 Pinguecula, bilateral: Secondary | ICD-10-CM | POA: Diagnosis not present

## 2016-10-29 ENCOUNTER — Encounter: Payer: Self-pay | Admitting: Nurse Practitioner

## 2016-11-15 ENCOUNTER — Encounter: Payer: Medicare Other | Admitting: Internal Medicine

## 2016-11-26 DIAGNOSIS — M25512 Pain in left shoulder: Secondary | ICD-10-CM | POA: Diagnosis not present

## 2016-11-27 DIAGNOSIS — E039 Hypothyroidism, unspecified: Secondary | ICD-10-CM | POA: Diagnosis not present

## 2016-11-27 DIAGNOSIS — E559 Vitamin D deficiency, unspecified: Secondary | ICD-10-CM | POA: Diagnosis not present

## 2016-11-27 DIAGNOSIS — I10 Essential (primary) hypertension: Secondary | ICD-10-CM | POA: Diagnosis not present

## 2016-11-27 DIAGNOSIS — D519 Vitamin B12 deficiency anemia, unspecified: Secondary | ICD-10-CM | POA: Diagnosis not present

## 2016-11-27 DIAGNOSIS — E782 Mixed hyperlipidemia: Secondary | ICD-10-CM | POA: Diagnosis not present

## 2016-11-27 DIAGNOSIS — E119 Type 2 diabetes mellitus without complications: Secondary | ICD-10-CM | POA: Diagnosis not present

## 2016-12-10 DIAGNOSIS — M79601 Pain in right arm: Secondary | ICD-10-CM | POA: Diagnosis not present

## 2016-12-10 DIAGNOSIS — M7521 Bicipital tendinitis, right shoulder: Secondary | ICD-10-CM | POA: Diagnosis not present

## 2016-12-20 DIAGNOSIS — M7521 Bicipital tendinitis, right shoulder: Secondary | ICD-10-CM | POA: Diagnosis not present

## 2016-12-20 DIAGNOSIS — M25511 Pain in right shoulder: Secondary | ICD-10-CM | POA: Diagnosis not present

## 2016-12-25 DIAGNOSIS — M7521 Bicipital tendinitis, right shoulder: Secondary | ICD-10-CM | POA: Diagnosis not present

## 2016-12-25 DIAGNOSIS — M25511 Pain in right shoulder: Secondary | ICD-10-CM | POA: Diagnosis not present

## 2017-01-11 DIAGNOSIS — M7521 Bicipital tendinitis, right shoulder: Secondary | ICD-10-CM | POA: Diagnosis not present

## 2017-02-19 ENCOUNTER — Other Ambulatory Visit: Payer: Self-pay | Admitting: Nurse Practitioner

## 2017-02-19 ENCOUNTER — Encounter (INDEPENDENT_AMBULATORY_CARE_PROVIDER_SITE_OTHER): Payer: Self-pay

## 2017-02-19 ENCOUNTER — Other Ambulatory Visit: Payer: Self-pay | Admitting: *Deleted

## 2017-02-19 MED ORDER — SIMVASTATIN 40 MG PO TABS: 40.0000 mg | ORAL_TABLET | Freq: Every day | ORAL | 1 refills | Status: DC

## 2017-02-19 MED ORDER — SIMVASTATIN 40 MG PO TABS: 40.0000 mg | ORAL_TABLET | Freq: Every day | ORAL | 0 refills | Status: DC

## 2017-02-20 ENCOUNTER — Other Ambulatory Visit: Payer: Self-pay | Admitting: Nurse Practitioner

## 2017-02-20 MED ORDER — AMLODIPINE BESYLATE 2.5 MG PO TABS: 2.5000 mg | ORAL_TABLET | Freq: Every day | ORAL | 1 refills | Status: AC

## 2017-02-20 MED ORDER — SIMVASTATIN 40 MG PO TABS: 40.0000 mg | ORAL_TABLET | Freq: Every day | ORAL | 1 refills | Status: AC

## 2017-02-20 MED ORDER — NITROGLYCERIN 0.4 MG SL SUBL: 0.4000 mg | SUBLINGUAL_TABLET | SUBLINGUAL | 1 refills | Status: AC | PRN

## 2017-03-15 ENCOUNTER — Encounter (INDEPENDENT_AMBULATORY_CARE_PROVIDER_SITE_OTHER): Payer: Self-pay

## 2017-04-09 ENCOUNTER — Encounter: Payer: Self-pay | Admitting: Nurse Practitioner

## 2017-07-02 ENCOUNTER — Ambulatory Visit: Payer: Medicare Other | Admitting: Nurse Practitioner

## 2018-01-18 IMAGING — NM NM MISC PROCEDURE
3 series · 18 of 18 positions shown · non-contrast
Comparison: none

[Series 1: stress-sum-em_(id)_sa · 6.4mm · 6.40mm/px · 6 of 64 frames shown]
[frame 6/64]
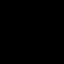
[frame 16/64]
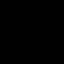
[frame 27/64]
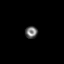
[frame 38/64]
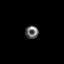
[frame 48/64]
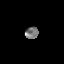
[frame 59/64]
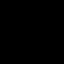

[Series 1: rest_(id)_sa · 6.4mm · 6.40mm/px · 6 of 64 frames shown]
[frame 6/64]
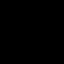
[frame 16/64]
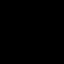
[frame 27/64]
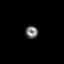
[frame 38/64]
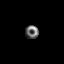
[frame 48/64]
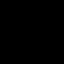
[frame 59/64]
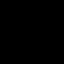

[Series 1: stress-gsp_(id)_sa · 6.4mm · 6.40mm/px · 6 of 512 frames shown]
[frame 43/512]
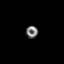
[frame 128/512]
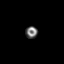
[frame 214/512]
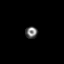
[frame 299/512]
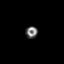
[frame 384/512]
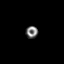
[frame 470/512]
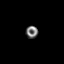

[18 of 18 positions shown; findings below may reference images not displayed]

Canned report from images found in remote index.

Refer to host system for actual result text.

## 2018-12-04 DIAGNOSIS — Z20828 Contact with and (suspected) exposure to other viral communicable diseases: Secondary | ICD-10-CM | POA: Diagnosis not present
